# Patient Record
Sex: Male | Born: 1935 | Race: White | Hispanic: No | Marital: Married | Smoking: Former smoker
Health system: Southern US, Community
[De-identification: ages and names within clinical notes are randomized; demographics above are authoritative.]

## PROBLEM LIST (undated history)

## (undated) DIAGNOSIS — E785 Hyperlipidemia, unspecified: Secondary | ICD-10-CM

## (undated) DIAGNOSIS — I2510 Atherosclerotic heart disease of native coronary artery without angina pectoris: Secondary | ICD-10-CM

## (undated) DIAGNOSIS — G40909 Epilepsy, unspecified, not intractable, without status epilepticus: Secondary | ICD-10-CM

## (undated) DIAGNOSIS — G4733 Obstructive sleep apnea (adult) (pediatric): Secondary | ICD-10-CM

## (undated) DIAGNOSIS — R3129 Other microscopic hematuria: Secondary | ICD-10-CM

## (undated) DIAGNOSIS — B351 Tinea unguium: Secondary | ICD-10-CM

## (undated) DIAGNOSIS — E039 Hypothyroidism, unspecified: Secondary | ICD-10-CM

## (undated) DIAGNOSIS — I119 Hypertensive heart disease without heart failure: Secondary | ICD-10-CM

## (undated) DIAGNOSIS — K5790 Diverticulosis of intestine, part unspecified, without perforation or abscess without bleeding: Secondary | ICD-10-CM

## (undated) DIAGNOSIS — R413 Other amnesia: Secondary | ICD-10-CM

## (undated) DIAGNOSIS — E669 Obesity, unspecified: Secondary | ICD-10-CM

## (undated) DIAGNOSIS — I10 Essential (primary) hypertension: Secondary | ICD-10-CM

## (undated) HISTORY — DX: Obesity, unspecified: E66.9

## (undated) HISTORY — DX: Hypertensive heart disease without heart failure: I11.9

## (undated) HISTORY — DX: Tinea unguium: B35.1

## (undated) HISTORY — DX: Other microscopic hematuria: R31.29

## (undated) HISTORY — DX: Hyperlipidemia, unspecified: E78.5

## (undated) HISTORY — DX: Epilepsy, unspecified, not intractable, without status epilepticus: G40.909

## (undated) HISTORY — DX: Essential (primary) hypertension: I10

## (undated) HISTORY — DX: Atherosclerotic heart disease of native coronary artery without angina pectoris: I25.10

## (undated) HISTORY — DX: Diverticulosis of intestine, part unspecified, without perforation or abscess without bleeding: K57.90

## (undated) HISTORY — DX: Other amnesia: R41.3

## (undated) HISTORY — DX: Hypothyroidism, unspecified: E03.9

## (undated) HISTORY — DX: Obstructive sleep apnea (adult) (pediatric): G47.33

---

## 1999-07-17 HISTORY — PX: COLONOSCOPY: SHX174

## 2000-04-03 ENCOUNTER — Encounter (INDEPENDENT_AMBULATORY_CARE_PROVIDER_SITE_OTHER): Payer: Self-pay | Admitting: Specialist

## 2001-11-01 ENCOUNTER — Emergency Department (HOSPITAL_COMMUNITY): Admission: EM | Admit: 2001-11-01 | Discharge: 2001-11-01 | Payer: Self-pay | Admitting: Emergency Medicine

## 2003-08-29 ENCOUNTER — Emergency Department (HOSPITAL_COMMUNITY): Admission: EM | Admit: 2003-08-29 | Discharge: 2003-08-29 | Payer: Self-pay | Admitting: Emergency Medicine

## 2003-09-12 ENCOUNTER — Observation Stay (HOSPITAL_COMMUNITY): Admission: EM | Admit: 2003-09-12 | Discharge: 2003-09-13 | Payer: Self-pay | Admitting: Emergency Medicine

## 2004-06-26 ENCOUNTER — Ambulatory Visit: Payer: Self-pay | Admitting: Internal Medicine

## 2004-10-16 ENCOUNTER — Ambulatory Visit: Payer: Self-pay | Admitting: Internal Medicine

## 2004-12-06 ENCOUNTER — Ambulatory Visit: Payer: Self-pay | Admitting: Internal Medicine

## 2005-01-22 ENCOUNTER — Ambulatory Visit: Payer: Self-pay | Admitting: Pulmonary Disease

## 2006-04-09 ENCOUNTER — Ambulatory Visit: Payer: Self-pay | Admitting: Internal Medicine

## 2006-05-03 ENCOUNTER — Ambulatory Visit: Payer: Self-pay | Admitting: Internal Medicine

## 2006-05-03 LAB — CONVERTED CEMR LAB
ALT: 30 units/L (ref 0–40)
Basophils Relative: 0.6 % (ref 0.0–1.0)
Bilirubin Urine: NEGATIVE
CO2: 30 meq/L (ref 19–32)
Calcium: 9.4 mg/dL (ref 8.4–10.5)
Chloride: 102 meq/L (ref 96–112)
Glomerular Filtration Rate, Af Am: 70 mL/min/{1.73_m2}
Glucose, Bld: 101 mg/dL — ABNORMAL HIGH (ref 70–99)
HDL: 45.8 mg/dL (ref >39.0)
Ketones, ur: NEGATIVE mg/dL
LDL Cholesterol: 58 mg/dL (ref 0–99)
Leukocytes, UA: NEGATIVE
Lymphocytes Relative: 25.3 % (ref 12.0–46.0)
MCV: 95.4 fL (ref 78.0–100.0)
Monocytes Absolute: 0.4 10*3/uL (ref 0.2–0.7)
Nitrite: NEGATIVE
Platelets: 250 10*3/uL (ref 150–400)
TSH: 2.36 microintl units/mL (ref 0.35–5.50)
Total Protein, Urine: 30 mg/dL — AB
Triglyceride fasting, serum: 79 mg/dL (ref 0–149)
Urine Glucose: NEGATIVE mg/dL
VLDL: 16 mg/dL (ref 0–40)
WBC: 5.6 10*3/uL (ref 4.5–10.5)
pH: 7.5 (ref 5.0–8.0)

## 2007-05-06 ENCOUNTER — Ambulatory Visit: Payer: Self-pay | Admitting: Critical Care Medicine

## 2007-06-06 ENCOUNTER — Encounter: Payer: Self-pay | Admitting: Internal Medicine

## 2007-06-06 DIAGNOSIS — B351 Tinea unguium: Secondary | ICD-10-CM

## 2007-06-06 DIAGNOSIS — E785 Hyperlipidemia, unspecified: Secondary | ICD-10-CM

## 2007-06-06 DIAGNOSIS — E669 Obesity, unspecified: Secondary | ICD-10-CM | POA: Insufficient documentation

## 2007-06-06 DIAGNOSIS — I2510 Atherosclerotic heart disease of native coronary artery without angina pectoris: Secondary | ICD-10-CM | POA: Insufficient documentation

## 2007-06-06 DIAGNOSIS — R569 Unspecified convulsions: Secondary | ICD-10-CM | POA: Insufficient documentation

## 2007-06-06 DIAGNOSIS — G4733 Obstructive sleep apnea (adult) (pediatric): Secondary | ICD-10-CM | POA: Insufficient documentation

## 2007-06-06 DIAGNOSIS — I10 Essential (primary) hypertension: Secondary | ICD-10-CM | POA: Insufficient documentation

## 2007-06-06 DIAGNOSIS — E039 Hypothyroidism, unspecified: Secondary | ICD-10-CM | POA: Insufficient documentation

## 2007-06-06 DIAGNOSIS — K5732 Diverticulitis of large intestine without perforation or abscess without bleeding: Secondary | ICD-10-CM

## 2007-06-11 ENCOUNTER — Telehealth (INDEPENDENT_AMBULATORY_CARE_PROVIDER_SITE_OTHER): Payer: Self-pay | Admitting: *Deleted

## 2007-07-01 ENCOUNTER — Telehealth (INDEPENDENT_AMBULATORY_CARE_PROVIDER_SITE_OTHER): Payer: Self-pay | Admitting: *Deleted

## 2007-07-21 ENCOUNTER — Ambulatory Visit: Payer: Self-pay | Admitting: Internal Medicine

## 2007-07-21 DIAGNOSIS — R3129 Other microscopic hematuria: Secondary | ICD-10-CM

## 2007-07-21 DIAGNOSIS — I119 Hypertensive heart disease without heart failure: Secondary | ICD-10-CM | POA: Insufficient documentation

## 2007-07-28 ENCOUNTER — Ambulatory Visit: Payer: Self-pay | Admitting: Internal Medicine

## 2007-08-08 ENCOUNTER — Encounter: Payer: Self-pay | Admitting: Internal Medicine

## 2007-08-25 ENCOUNTER — Encounter: Payer: Self-pay | Admitting: Internal Medicine

## 2007-12-19 ENCOUNTER — Encounter (INDEPENDENT_AMBULATORY_CARE_PROVIDER_SITE_OTHER): Payer: Self-pay | Admitting: Family Medicine

## 2008-06-17 ENCOUNTER — Encounter: Payer: Self-pay | Admitting: Internal Medicine

## 2008-06-23 ENCOUNTER — Telehealth: Payer: Self-pay | Admitting: Internal Medicine

## 2008-07-06 ENCOUNTER — Encounter: Payer: Self-pay | Admitting: Internal Medicine

## 2008-07-16 HISTORY — PX: TUMOR EXCISION: SHX421

## 2008-07-16 HISTORY — PX: CARDIAC CATHETERIZATION: SHX172

## 2008-07-19 ENCOUNTER — Ambulatory Visit: Payer: Self-pay | Admitting: Internal Medicine

## 2008-07-19 DIAGNOSIS — C801 Malignant (primary) neoplasm, unspecified: Secondary | ICD-10-CM | POA: Insufficient documentation

## 2008-11-09 ENCOUNTER — Encounter: Payer: Self-pay | Admitting: Internal Medicine

## 2008-12-15 ENCOUNTER — Encounter: Payer: Self-pay | Admitting: Internal Medicine

## 2008-12-20 ENCOUNTER — Telehealth (INDEPENDENT_AMBULATORY_CARE_PROVIDER_SITE_OTHER): Payer: Self-pay | Admitting: *Deleted

## 2008-12-30 ENCOUNTER — Ambulatory Visit: Payer: Self-pay | Admitting: Internal Medicine

## 2009-01-18 ENCOUNTER — Encounter: Payer: Self-pay | Admitting: Internal Medicine

## 2009-04-19 ENCOUNTER — Encounter: Payer: Self-pay | Admitting: Internal Medicine

## 2009-07-19 ENCOUNTER — Encounter: Payer: Self-pay | Admitting: Internal Medicine

## 2009-07-25 ENCOUNTER — Ambulatory Visit: Payer: Self-pay | Admitting: Internal Medicine

## 2009-07-26 ENCOUNTER — Encounter: Payer: Self-pay | Admitting: Internal Medicine

## 2009-07-27 LAB — CONVERTED CEMR LAB
ALT: 19 units/L (ref 0–53)
AST: 20 units/L (ref 0–37)
Albumin: 4 g/dL (ref 3.5–5.2)
BUN: 18 mg/dL (ref 6–23)
Basophils Relative: 0.8 % (ref 0.0–3.0)
Bilirubin Urine: NEGATIVE
CO2: 31 meq/L (ref 19–32)
Chloride: 100 meq/L (ref 96–112)
Cholesterol: 155 mg/dL (ref 0–200)
Creatinine, Ser: 1.1 mg/dL (ref 0.4–1.5)
Eosinophils Absolute: 0.3 10*3/uL (ref 0.0–0.7)
Eosinophils Relative: 7.7 % — ABNORMAL HIGH (ref 0.0–5.0)
HCT: 38.2 % — ABNORMAL LOW (ref 39.0–52.0)
LDL Cholesterol: 86 mg/dL (ref 0–99)
Leukocytes, UA: NEGATIVE
Lymphs Abs: 0.8 10*3/uL (ref 0.7–4.0)
MCHC: 34.1 g/dL (ref 30.0–36.0)
MCV: 101 fL — ABNORMAL HIGH (ref 78.0–100.0)
Monocytes Absolute: 0.3 10*3/uL (ref 0.1–1.0)
Neutrophils Relative %: 61.7 % (ref 43.0–77.0)
Nitrite: NEGATIVE
Platelets: 201 10*3/uL (ref 150.0–400.0)
Potassium: 5.1 meq/L (ref 3.5–5.1)
TSH: 2.45 microintl units/mL (ref 0.35–5.50)
Triglycerides: 74 mg/dL (ref 0.0–149.0)
Urobilinogen, UA: 0.2 (ref 0.0–1.0)
WBC: 3.9 10*3/uL — ABNORMAL LOW (ref 4.5–10.5)

## 2010-01-17 ENCOUNTER — Encounter: Payer: Self-pay | Admitting: Internal Medicine

## 2010-02-13 ENCOUNTER — Telehealth (INDEPENDENT_AMBULATORY_CARE_PROVIDER_SITE_OTHER): Payer: Self-pay | Admitting: *Deleted

## 2010-02-14 ENCOUNTER — Ambulatory Visit: Payer: Self-pay | Admitting: Internal Medicine

## 2010-02-14 DIAGNOSIS — R413 Other amnesia: Secondary | ICD-10-CM | POA: Insufficient documentation

## 2010-02-15 ENCOUNTER — Ambulatory Visit: Payer: Self-pay | Admitting: Cardiology

## 2010-02-15 LAB — CONVERTED CEMR LAB
BUN: 19 mg/dL (ref 6–23)
Basophils Absolute: 0 10*3/uL (ref 0.0–0.1)
CO2: 30 meq/L (ref 19–32)
Calcium: 9.4 mg/dL (ref 8.4–10.5)
Creatinine, Ser: 1.2 mg/dL (ref 0.4–1.5)
Eosinophils Absolute: 0.2 10*3/uL (ref 0.0–0.7)
Glucose, Bld: 92 mg/dL (ref 70–99)
Hemoglobin: 13 g/dL (ref 13.0–17.0)
Lymphocytes Relative: 25.7 % (ref 12.0–46.0)
MCHC: 34.4 g/dL (ref 30.0–36.0)
Neutro Abs: 3.9 10*3/uL (ref 1.4–7.7)
Neutrophils Relative %: 62.4 % (ref 43.0–77.0)
Platelets: 229 10*3/uL (ref 150.0–400.0)
RDW: 13.1 % (ref 11.5–14.6)
TSH: 3.12 microintl units/mL (ref 0.35–5.50)

## 2010-02-23 ENCOUNTER — Encounter (INDEPENDENT_AMBULATORY_CARE_PROVIDER_SITE_OTHER): Payer: Self-pay | Admitting: *Deleted

## 2010-03-02 ENCOUNTER — Ambulatory Visit: Payer: Self-pay | Admitting: Internal Medicine

## 2010-03-02 ENCOUNTER — Encounter: Payer: Self-pay | Admitting: Adult Health

## 2010-03-02 ENCOUNTER — Telehealth: Payer: Self-pay | Admitting: Adult Health

## 2010-03-03 LAB — CONVERTED CEMR LAB: Folate: >20 ng/mL

## 2010-04-18 ENCOUNTER — Encounter: Payer: Self-pay | Admitting: Internal Medicine

## 2010-05-26 ENCOUNTER — Ambulatory Visit: Payer: Self-pay | Admitting: Cardiology

## 2010-05-26 ENCOUNTER — Inpatient Hospital Stay (HOSPITAL_COMMUNITY): Admission: EM | Admit: 2010-05-26 | Discharge: 2010-05-29 | Payer: Self-pay | Admitting: Emergency Medicine

## 2010-05-29 ENCOUNTER — Encounter (INDEPENDENT_AMBULATORY_CARE_PROVIDER_SITE_OTHER): Payer: Self-pay | Admitting: Internal Medicine

## 2010-05-30 ENCOUNTER — Telehealth (INDEPENDENT_AMBULATORY_CARE_PROVIDER_SITE_OTHER): Payer: Self-pay | Admitting: *Deleted

## 2010-06-05 ENCOUNTER — Ambulatory Visit: Payer: Self-pay | Admitting: Internal Medicine

## 2010-06-05 DIAGNOSIS — N259 Disorder resulting from impaired renal tubular function, unspecified: Secondary | ICD-10-CM | POA: Insufficient documentation

## 2010-06-06 LAB — CONVERTED CEMR LAB
BUN: 17 mg/dL (ref 6–23)
Chloride: 97 meq/L (ref 96–112)
Creatinine, Ser: 1.1 mg/dL (ref 0.4–1.5)

## 2010-07-05 ENCOUNTER — Ambulatory Visit: Payer: Self-pay | Admitting: Internal Medicine

## 2010-07-18 ENCOUNTER — Encounter: Payer: Self-pay | Admitting: Internal Medicine

## 2010-08-13 LAB — CONVERTED CEMR LAB
ALT: 23 units/L (ref 0–53)
ALT: 28 units/L (ref 0–53)
Albumin: 4 g/dL (ref 3.5–5.2)
Albumin: 4.1 g/dL (ref 3.5–5.2)
Alkaline Phosphatase: 55 units/L (ref 39–117)
BUN: 15 mg/dL (ref 6–23)
Bacteria, UA: NEGATIVE
Basophils Absolute: 0 10*3/uL (ref 0.0–0.1)
Basophils Relative: 0.7 % (ref 0.0–1.0)
Bilirubin Urine: NEGATIVE
Bilirubin Urine: NEGATIVE
CO2: 30 meq/L (ref 19–32)
Calcium: 9.6 mg/dL (ref 8.4–10.5)
Cholesterol: 104 mg/dL (ref 0–200)
Creatinine, Ser: 1.2 mg/dL (ref 0.4–1.5)
Crystals: NEGATIVE
Crystals: NEGATIVE
GFR calc Af Amer: 76 mL/min
Ketones, ur: NEGATIVE mg/dL
LDL Cholesterol: 42 mg/dL (ref 0–99)
LDL Cholesterol: 51 mg/dL (ref 0–99)
Leukocytes, UA: NEGATIVE
Leukocytes, UA: NEGATIVE
Monocytes Relative: 7.9 % (ref 3.0–11.0)
Platelets: 233 10*3/uL (ref 150–400)
RBC: 4.39 M/uL (ref 4.22–5.81)
RDW: 12.4 % (ref 11.5–14.6)
Specific Gravity, Urine: 1.02 (ref 1.000–1.03)
Specific Gravity, Urine: 1.02 (ref 1.000–1.03)
TSH: 2.28 microintl units/mL (ref 0.35–5.50)
Total CHOL/HDL Ratio: 2.2
Total CHOL/HDL Ratio: 2.3
Total Protein, Urine: 100 mg/dL — AB
Total Protein: 6.7 g/dL (ref 6.0–8.3)
Total Protein: 7 g/dL (ref 6.0–8.3)
Triglycerides: 88 mg/dL (ref 0–149)
Triglycerides: 89 mg/dL (ref 0–149)
Urine Glucose: NEGATIVE mg/dL
Urine Glucose: NEGATIVE mg/dL
Urobilinogen, UA: 0.2 (ref 0.0–1.0)
VLDL: 18 mg/dL (ref 0–40)
VLDL: 18 mg/dL (ref 0–40)
WBC, UA: NONE SEEN cells/hpf
pH: 6 (ref 5.0–8.0)

## 2010-08-17 NOTE — Assessment & Plan Note (Signed)
Summary: NP office visit - MMSE   Primary Provider/Referring Provider:  Sherene Sires  CC:  MMSE - no new complaints.  History of Present Illness: 52 yowm with  history of hypertenision, hyperlipidemia, and hypothyroidism.    July 19, 2008 cpx recovering from sarcoma surgery at [facility] and has been referred to Oncology and planning to start chemo with extensive labs and xrays there in 12/09.    December 30, 2008 ov for eval of orthostatic lightheadedness resolved off amlodipine for recheck bp doing well now.  rec stop amlodipine and no more spells ok self monitoring  July 25, 2009 ov fastin for recheck bp/lipids/hypothyroid.  Energy level good, bp a bit high but no symptoms of ha, dizziness.   February 14, 2010 Acute visit.  Pt's spouse and children are concerned that pt is depressed- "less engaged" then he used to be.  Spouse states that he spends most of the day resting.  He also has been more irritable. Family has noticed memory loss over the past 6 months.  He also c/o some hearing loss but is not concerned re their perceived concerns.    March 02, 2010--Presents for follow up and memory evaluation. Family has noticed over last 6 months his memory is not as good at times. He easily forgets things. Pt says he feels he has slowed down since retirement last few years. His wife is very busy, he helps with her projects and helps with church work.  He denies any tremor or headache. Labs from last visit were essentially unremarkable.   Medications Prior to Update: 1)  Zocor 40 Mg Tabs (Simvastatin) .... Take 1 Tablet By Mouth Once A Day 2)  Synthroid 75 Mcg  Tabs (Levothyroxine Sodium) .Marland Kitchen.. 1 By Mouth Once Daily 3)  Multi Vitamins .... Once Daily 4)  Warfarin Sodium 1 Mg Tabs (Warfarin Sodium) .Marland Kitchen.. 1 Once Daily 5)  Tylenol 500mg  .... As Needed 6)  Advil 200 Mg Tabs (Ibuprofen) .Marland Kitchen.. 1-4 With Meal (Up To 12 Daily) 7)  Atenolol-Chlorthalidone 50-25 Mg Tabs (Atenolol-Chlorthalidone) .... One Half  Daily  Current Medications (verified): 1)  Zocor 40 Mg Tabs (Simvastatin) .... Take 1 Tablet By Mouth Once A Day 2)  Synthroid 75 Mcg  Tabs (Levothyroxine Sodium) .Marland Kitchen.. 1 By Mouth Once Daily 3)  Multivitamins   Tabs (Multiple Vitamin) .... Take 1 Tablet By Mouth Once A Day 4)  Warfarin Sodium 1 Mg Tabs (Warfarin Sodium) .Marland Kitchen.. 1 Once Daily 5)  Tylenol Extra Strength 500 Mg Tabs (Acetaminophen) .... Per Bottle 6)  Advil 200 Mg Tabs (Ibuprofen) .Marland Kitchen.. 1-4 With Meal (Up To 12 Daily) 7)  Atenolol-Chlorthalidone 50-25 Mg Tabs (Atenolol-Chlorthalidone) .... One Half Daily  Allergies (verified): No Known Drug Allergies  Past History:  Past Surgical History: Last updated: 07/25/2009 colonoscopy 2001 Heart cath 2010  Family History: Last updated: 07/19/2008 IHD father, smoker Breast Ca Father  Social History: Last updated: 07/25/2009 Patient states former smoker quit around 1970 ETOH- Wine occ Retired Surveyor, minerals  Risk Factors: Smoking Status: quit (07/21/2007)  Past Medical History: MICROSCOPIC HEMATURIA (ICD-599.72)...................................................Marland KitchenWrenn HYPERTENSIVE CARDIOVASCULAR DISEASE, BENIGN (ICD-402.10) ONYCHOMYCOSIS (ICD-110.1) DIVERTICULOSIS.........................................................................................Marland KitchenRussella Dar       - See colonscopy 03/2000 HYPERLIPIDEMIA (ICD-272.4)      - Target LDL < 70 (hbp, male, pos fm hx/ pos mrangiogram 2/05 ascvd SLEEP APNEA, OBSTRUCTIVE (ICD-327.23) OBESITY (ICD-278.00)     -  Target wt  =   208 for BMI < 30  HYPOTHYROIDISM (ICD-244.9) SEIZURE DISORDER (ICD-780.39) CARDIOVASCULAR DISEASE (ICD-429.2) HYPERTENSION (ICD-401.9) Sarcoma  left leg........................................................................................................Marland KitchenWard, [facility])      - 07/14/08 sarcoma removal left femur >  last rx July 2010 > CR by scans 07/2009      - Chemo complete 12/2008 Memory Loss indolent onset  20111      - CT Head February 14, 2010 >>>chronic microvas./ischemic changes      - MMSE rec February 14, 2010 >>30/30, nml clock draw HEALTH MAINTENANCE.............................................................................................Marland KitchenWert      - Pneumovax 04/1999      - DT 10/07      - CPX   July 25, 2009   Review of Systems      See HPI  Vital Signs:  Patient profile:   77 year old male Height:      71 inches Weight:      201 pounds BMI:     28.14 O2 Sat:      96 % on Room air Temp:     98.2 degrees F oral Pulse rate:   71 / minute BP sitting:   130 / 80  (left arm) Cuff size:   regular  Vitals Entered By: Boone Master CNA/MA (March 02, 2010 2:27 PM)  O2 Flow:  Room air CC: MMSE - no new complaints Is Patient Diabetic? No Comments Medications reviewed with patient Daytime contact number verified with patient. Boone Master CNA/MA  March 02, 2010 2:27 PM    Physical Exam  Additional Exam:  wt 214  July 19, 2008 >  198 December 31, 2008  > 196 July 25, 2009 > 199 February 14, 2010 >>201 03/02/10 Ambulatory healthy appearing in no acute distress. HEENT: nl dentition, turbinates, and orophanx. Nl external ear canals without cough reflex/ grossly nl hearing Neck without JVD/Nodes/TM Lungs clear to A and P bilaterally without cough on insp or exp maneuvers RRR no s3 or murmur or increase in P2 Abd soft and benign with nl excursion in the supine position. No bruits or organomegaly Ext warm without calf tenderness, cyanosis clubbing.   Neuro alert, 0x4, good short and longterm recall.    NML CLOCK DRAW MMSE 30/30       Impression & Recommendations:  Problem # 1:  MEMORY LOSS (ICD-780.93) Subtle memory impairment mostly noticed by family. pt appears w/ no obvious deficits today.  RPR neg, B12 nml He completed the MMSE and clock draq very well, with no apparent difficulty.  I have explained to pt and his wife (on phone) my findings,  will plan on repeat  in 1 year if cont to have noticed memory issues and sooner if needed may need neuro referral if test cont nml but memory cont to decrease advised on "brain teasers" , etc  Orders: T-RPR (Syphilis) (16109-60454) TLB-B12 + Folate Pnl (09811_91478-G95/AOZ) Est. Patient Level IV (30865)  Medications Added to Medication List This Visit: 1)  Multivitamins Tabs (Multiple vitamin) .... Take 1 tablet by mouth once a day 2)  Tylenol Extra Strength 500 Mg Tabs (Acetaminophen) .... Per bottle  Complete Medication List: 1)  Zocor 40 Mg Tabs (Simvastatin) .... Take 1 tablet by mouth once a day 2)  Synthroid 75 Mcg Tabs (Levothyroxine sodium) .Marland Kitchen.. 1 by mouth once daily 3)  Multivitamins Tabs (Multiple vitamin) .... Take 1 tablet by mouth once a day 4)  Warfarin Sodium 1 Mg Tabs (Warfarin sodium) .Marland Kitchen.. 1 once daily 5)  Tylenol Extra Strength 500 Mg Tabs (Acetaminophen) .... Per bottle 6)  Advil 200 Mg Tabs (Ibuprofen) .Marland Kitchen.. 1-4 with meal (  up to 12 daily) 7)  Atenolol-chlorthalidone 50-25 Mg Tabs (Atenolol-chlorthalidone) .... One half daily  Patient Instructions: 1)  Keep acitve, "brain teasers" keep lists, stay organized,  2)  cross word puzzles, board games, reading, etc,  3)  We can recheck your memory test in 1 year if still having trouble 4)  I will call with lab results.  5)  Please contact office for sooner follow up if symptoms do not improve or worsen    Immunization History:  Influenza Immunization History:    Influenza:  historical (05/16/2009)

## 2010-08-17 NOTE — Assessment & Plan Note (Signed)
Summary: Primary svc/ ext post hosp f/u ov for syncope   Primary Provider/Referring Provider:  Sherene Sires  CC:  Dizziness- resolved.  History of Present Illness: 45 yowm with  history of hypertenision, hyperlipidemia, and hypothyroidism.    July 19, 2008 cpx recovering from sarcoma surgery at [facility] and has been referred to Oncology and planning to start chemo with extensive labs and xrays there in 12/09.    December 30, 2008 ov for eval of orthostatic lightheadedness resolved off amlodipine for recheck bp doing well now.  rec stop amlodipine and no more spells ok self monitoring  July 25, 2009 ov fastin for recheck bp/lipids/hypothyroid.  Energy level good, bp a bit high but no symptoms of ha, dizziness.   February 14, 2010 Acute visit.  Pt's spouse and children are concerned that pt is depressed- "less engaged" then he used to be.  Spouse states that he spends most of the day resting.  He also has been more irritable. Family has noticed memory loss over the past 6 months.  He also c/o some hearing loss but is not concerned re their perceived concerns.    March 02, 2010--Presents for follow up and memory evaluation.> . 30/30, nml clock draw so rec recheck yearly  [facility] 11/11-14/2011 syncope, hyponatremia, nl echo and MRA of brain > d/c hctz   June 05, 2010 ov post hosp for syncope no recurrence. Pt denies any significant sore throat, dysphagia, itching, sneezing,  nasal congestion or excess secretions,  fever, chills, sweats, unintended wt loss, pleuritic or exertional cp, hempoptysis, change in activity tolerance  orthopnea pnd or leg swelling.    Current Medications (verified): 1)  Zocor 40 Mg Tabs (Simvastatin) .... Take 1 Tablet By Mouth Once A Day 2)  Synthroid 75 Mcg  Tabs (Levothyroxine Sodium) .Marland Kitchen.. 1 By Mouth Once Daily 3)  Multivitamins   Tabs (Multiple Vitamin) .... Take 1 Tablet By Mouth Once A Day 4)  Warfarin Sodium 1 Mg Tabs (Warfarin Sodium) .Marland Kitchen.. 1 Once Daily 5)   Tylenol Extra Strength 500 Mg Tabs (Acetaminophen) .... Per Bottle 6)  Advil 200 Mg Tabs (Ibuprofen) .Marland Kitchen.. 1-4 With Meal (Up To 12 Daily) 7)  Aspirin 81 Mg Tbec (Aspirin) .Marland Kitchen.. 1 Once Daily 8)  Atenolol 25 Mg Tabs (Atenolol) .Marland Kitchen.. 1 Once Daily 9)  Losartan Potassium 50 Mg Tabs (Losartan Potassium) .Marland Kitchen.. 1 Once Daily  Allergies (verified): No Known Drug Allergies  Past History:  Past Medical History: MICROSCOPIC HEMATURIA (ICD-599.72)...................................................Marland KitchenWrenn HYPERTENSIVE CARDIOVASCULAR DISEASE, BENIGN (ICD-402.10)     - Echo with G I dias dysfunction11/14/11 ONYCHOMYCOSIS (ICD-110.1) DIVERTICULOSIS.........................................................................................Marland KitchenRussella Dar       - See colonscopy 03/2000 HYPERLIPIDEMIA (ICD-272.4)      - Target LDL < 70 (hbp, male, pos fm hx/ pos mrangiogram 2/05 ascvd SLEEP APNEA, OBSTRUCTIVE (ICD-327.23) OBESITY (ICD-278.00)     -  Target wt  =   208 for BMI < 30  HYPOTHYROIDISM (ICD-244.9) SEIZURE DISORDER (ICD-780.39) CARDIOVASCULAR DISEASE (ICD-429.2) HYPERTENSION (ICD-401.9) Sarcoma left leg........................................................................................................Marland KitchenWard, [facility])      - 07/14/08 sarcoma removal left femur >  last rx July 2010 > CR by scans 07/2009      - Chemo complete 12/2008 Memory Loss indolent onset 20111      - CT Head February 14, 2010 >>>chronic microvas./ischemic changes      - MMSE rec February 14, 2010 >>30/30, nml clock draw      - MRI brain atrophy and small vessel dz worse since 2005 HEALTH MAINTENANCE.............................................................................................Marland KitchenWert      -  Pneumovax 04/1999      - DT 10/07      - CPX   July 25, 2009  Syncope  Admit [facility]   Vital Signs:  Patient profile:   77 year old male Weight:      202.38 pounds O2 Sat:      99 % on Room air Temp:     97.7 degrees F oral Pulse  rate:   76 / minute BP sitting:   158 / 70  (left arm)  Vitals Entered By: [name] (June 05, 2010 9:35 AM)  O2 Flow:  Room air  Physical Exam  Additional Exam:  wt 214  July 19, 2008 >  198 December 31, 2008  > 196 July 25, 2009 > 199 February 14, 2010 >>201 03/02/10 > 202 June 05, 2010  Ambulatory healthy appearing in no acute distress. HEENT: nl dentition, turbinates, and orophanx. Nl external ear canals without cough reflex/ grossly nl hearing Neck without JVD/Nodes/TM Lungs clear to A and P bilaterally without cough on insp or exp maneuvers RRR no s3 or murmur or increase in P2 Abd soft and benign with nl excursion in the supine position. No bruits or organomegaly Ext warm without calf tenderness, cyanosis clubbing.   Neuro alert, 0x4, good short and longterm recall.          Sodium               [L]  132 mEq/L                   135-145   Potassium                 4.6 mEq/L                   3.5-5.1   Chloride                  97 mEq/L                    96-112   Carbon Dioxide            28 mEq/L                    19-32   Glucose                   90 mg/dL                    04-54   BUN                       17 mg/dL                    0-98   Creatinine                1.1 mg/dL                   1.1-9.1   Calcium                   9.2 mg/dL                   4.7-82.9   GFR                       66.04 mL/min                >  60  Impression & Recommendations:  Problem # 1:  HYPERTENSION (ICD-401.9) ok on rx off hctz indefinitely, since syncope recent will let bp stay on high side for now  The following medications were removed from the medication list:    Atenolol-chlorthalidone 50-25 Mg Tabs (Atenolol-chlorthalidone) ..... One half daily His updated medication list for this problem includes:    Losartan Potassium 50 Mg Tabs (Losartan potassium) .Marland Kitchen... 1 once daily    Atenolol 25 Mg Tabs (Atenolol) .Marland Kitchen... 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  Orders: Est. Patient Level IV (25852)  Problem # 3:  MEMORY LOSS (ICD-780.93)  not progressive, f/u conservatively  Orders: Est. Patient Level IV (77824)  Medications Added to Medication List This Visit: 1)  Losartan Potassium 50 Mg Tabs (Losartan potassium) .Marland Kitchen.. 1 once daily 2)  Aspirin 81 Mg Tbec (Aspirin) .Marland Kitchen.. 1 once daily 3)  Atenolol 25 Mg Tabs (Atenolol) .Marland Kitchen.. 1 once daily  Other Orders: TLB-BMP (Basic Metabolic Panel-BMET) (80048-METABOL)  Patient Instructions: 1)  Please schedule a follow-up appointment in 4  weeks, sooner if needed  2)  if feel faint in future lie down as soon as possible

## 2010-08-17 NOTE — Letter (Signed)
Summary: OV/[facility]  OV/[facility]   Imported By: Sherian Rein 07/28/2009 14:53:01  _____________________________________________________________________  External Attachment:    Type:   Image     Comment:   External Document

## 2010-08-17 NOTE — Progress Notes (Signed)
Summary: speak to tp (returning call)  Phone Note Call from Patient Call back at Home Phone [phone]   Caller: Patient Call For: tammy parrett Summary of Call: pt's wife sharon Cerniglia says she is returning a call to tp re: f/u on pt's ov today.  Initial call taken by: Tivis Ringer, CNA,  March 02, 2010 5:18 PM  Follow-up for Phone Call        spoke with wife as pt requested, infor of ov findings Follow-up by: Tammy Parrett NP,  March 03, 2010 10:16 AM

## 2010-08-17 NOTE — Progress Notes (Signed)
Summary: Appt sched to for mult issues  Phone Note Call from Patient   Caller: Spouse Call For: [name] Summary of Call: NEED TO TALK TO DR Sherene Sires ABOUT PT'S CONDITION Initial call taken by: Rickard Patience,  February 13, 2010 4:00 PM  Follow-up for Phone Call        Spoke with pt's spouse.  She is concerned about pt being depressed- states that he lacks energy, does not engage in any activities that he used to enjoy, and also has had some memory loss.  She also wants referral to a hearing clinic for decreased hearing.  I advised needs ov- appt sched with MW for 3:30 tommorrow.   Follow-up by: [name],  February 13, 2010 5:12 PM

## 2010-08-17 NOTE — Letter (Signed)
Summary: [facility]  [facility]   Imported By: Lester Brush 03/28/2010 08:38:36  _____________________________________________________________________  External Attachment:    Type:   Image     Comment:   External Document

## 2010-08-17 NOTE — Assessment & Plan Note (Signed)
Summary: Primary svc/ f/u hbp/hyperlipidemia, not fasting   Primary Provider/Referring Provider:  Sherene Sires  CC:  Hypertenion and hyperlipidemia.  History of Present Illness: 89  yowm with  history of hypertenision, hyperlipidemia, and hypothyroidism.    July 19, 2008 cpx recovering from sarcoma surgery at [facility] and has been referred to Oncology and planning to start chemo with extensive labs and xrays there in 12/09.    December 30, 2008 ov for eval of orthostatic lightheadedness resolved off amlodipine for recheck bp doing well now.  rec stop amlodipine and no more spells ok self monitoring  July 25, 2009 ov fastin for recheck bp/lipids/hypothyroid.  Energy level good, bp a bit high but no symptoms of ha, dizziness.   February 14, 2010 Acute visit.  Pt's spouse and children are concerned that pt is depressed- "less engaged" then he used to be.  Spouse states that he spends most of the day resting.  He also has been more irritable. Family has noticed memory loss over the past 6 months.  He also c/o some hearing loss but is not concerned re their perceived concerns.    March 02, 2010--Presents for follow up and memory evaluation.> . 30/30, nml clock draw so rec recheck yearly  Admit [facility] 11/11-14/2011 syncope, hyponatremia, nl echo and MRA of brain > d/c hctz   June 05, 2010 ov post hosp for syncope no recurrence.   August 07, 2010 ov f/u hbp/ hyperlipidemia not fasting, no presyncope or cp/ tia or claudication.Pt denies any significant sore throat, dysphagia, itching, sneezing,  nasal congestion or excess secretions,  fever, chills, sweats, unintended wt loss, pleuritic or exertional cp, hempoptysis, change in activity tolerance  orthopnea pnd or leg swelling   Current Medications (verified): 1)  Zocor 40 Mg Tabs (Simvastatin) .... Take 1 Tablet By Mouth Once A Day 2)  Synthroid 75 Mcg  Tabs (Levothyroxine Sodium) .Marland Kitchen.. 1 By Mouth Once Daily 3)  Multivitamins   Tabs (Multiple Vitamin)  .... Take 1 Tablet By Mouth Once A Day 4)  Warfarin Sodium 1 Mg Tabs (Warfarin Sodium) .Marland Kitchen.. 1 Once Daily 5)  Losartan Potassium 50 Mg Tabs (Losartan Potassium) .Marland Kitchen.. 1 Once Daily 6)  Aspirin 81 Mg Tbec (Aspirin) .Marland Kitchen.. 1 Once Daily 7)  Atenolol 25 Mg Tabs (Atenolol) .Marland Kitchen.. 1 Once Daily 8)  Tylenol Extra Strength 500 Mg Tabs (Acetaminophen) .... Per Bottle 9)  Advil 200 Mg Tabs (Ibuprofen) .Marland Kitchen.. 1-4 With Meal (Up To 12 Daily)  Allergies (verified): No Known Drug Allergies  Past History:  Past Medical History: MICROSCOPIC HEMATURIA (ICD-599.72)...................................................Marland KitchenWrenn HYPERTENSIVE CARDIOVASCULAR DISEASE, BENIGN (ICD-402.10)     - Echo with G I dias dysfunction11/14/11 ONYCHOMYCOSIS (ICD-110.1) DIVERTICULOSIS.........................................................................................Marland KitchenRussella Dar       - See colonscopy 03/2000 HYPERLIPIDEMIA (ICD-272.4)      - Target LDL < 70 (hbp, male, pos fm hx/ pos mrangiogram 2/05 ascvd SLEEP APNEA, OBSTRUCTIVE (ICD-327.23) OBESITY (ICD-278.00)     -  Target wt  =   208 for BMI < 30  HYPOTHYROIDISM (ICD-244.9) SEIZURE DISORDER (ICD-780.39) CARDIOVASCULAR DISEASE (ICD-429.2) HYPERTENSION (ICD-401.9) Sarcoma left leg........................................................................................................Marland KitchenWard, [facility])      - 07/14/08 sarcoma removal left femur >  last rx July 2010 > CR by scans 07/2009      - Chemo complete 12/2008 Memory Loss indolent onset 20111      - CT Head February 14, 2010 >>>chronic microvas./ischemic changes      - MMSE rec February 14, 2010 >>30/30, nml clock draw      -  MRI brain atrophy and small vessel dz worse since 2005 HEALTH MAINTENANCE................................................................................................Marland KitchenWert      - Pneumovax 04/1999 age 3      - DT 10/07      - CPX   July 25, 2009  Syncope  Admit [facility]   Vital Signs:  Patient  profile:   77 year old male Weight:      205 pounds O2 Sat:      99 % on Room air Temp:     97.9 degrees F oral Pulse rate:   68 / minute BP sitting:   142 / 84  (left arm)  Vitals Entered By: [name] (August 07, 2010 9:44 AM)  O2 Flow:  Room air  Physical Exam  Additional Exam:  wt 214  July 19, 2008 >201 03/02/10 > 202 June 05, 2010 > 205 August 07, 2010  Ambulatory healthy appearing in no acute distress. HEENT: nl dentition, turbinates, and orophanx. Nl external ear canals without cough reflex/ grossly nl hearing Neck without JVD/Nodes/TM Lungs clear to A and P bilaterally without cough on insp or exp maneuvers RRR no s3 or murmur or increase in P2 Abd soft and benign with nl excursion in the supine position. No bruits or organomegaly Ext warm without calf tenderness, cyanosis clubbing.   Neuro alert, 0x4, good short and longterm recall.          Impression & Recommendations:  Problem # 1:  HYPERTENSION (ICD-401.9)  His updated medication list for this problem includes:    Losartan Potassium 50 Mg Tabs (Losartan potassium) .Marland Kitchen... 1 once daily    Atenolol 25 Mg Tabs (Atenolol) .Marland Kitchen... 1 once daily   ok on rx  Orders: Est. Patient Level III (62952)  Problem # 2:  HYPERLIPIDEMIA (ICD-272.4)  His updated medication list for this problem includes:    Zocor 40 Mg Tabs (Simvastatin) .Marland Kitchen... Take 1 tablet by mouth once a day  Labs Reviewed: SGOT: 20 (07/25/2009)   SGPT: 19 (07/25/2009)   HDL:54.70 (07/25/2009), 44.5 (07/19/2008)  LDL:86 (07/25/2009), 42 (07/19/2008)  Chol:155 (07/25/2009), 104 (07/19/2008)  Trig:74.0 (07/25/2009), 89 (07/19/2008)   cpx needed after next ? last f/u at [facility]  Orders: Est. Patient Level III (84132)  Patient Instructions: 1)  At this point we can do whatever follow up on the sarcoma is needed and refer you back if needed. 2)  Return to office in 3 months with CPX on return

## 2010-08-17 NOTE — Letter (Signed)
Summary: Sarcoma[facility]   Imported By: Sherian Rein 04/27/2010 13:26:43  _____________________________________________________________________  External Attachment:    Type:   Image     Comment:   External Document

## 2010-08-17 NOTE — Letter (Signed)
Summary: Sarcoma[facility]  Sarcoma/[facility]   Imported By: Sherian Rein 07/27/2009 10:17:39  _____________________________________________________________________  External Attachment:    Type:   Image     Comment:   External Document

## 2010-08-17 NOTE — Letter (Signed)
Summary: Colonoscopy Letter  [facility]  [address] [place], [place] [address]   Phone: [phone]  Fax: [phone]      February 23, 2010 MRN: [id number]   [name] [address] [place], [place]  [address]   Dear [name],   According to your medical record, it is time for you to schedule a Colonoscopy. The American Cancer Society recommends this procedure as a method to detect early colon cancer. Patients with a family history of colon cancer, or a personal history of colon polyps or inflammatory bowel disease are at increased risk.  This letter has beeen generated based on the recommendations made at the time of your procedure. If you feel that in your particular situation this may no longer apply, please contact our office.  Please call our office at [phone] to schedule this appointment or to update your records at your earliest convenience.  Thank you for cooperating with [place] to provide you with the very best care possible.   Sincerely,  Judie Petit T. [name], M.D.  [facility] [phone]

## 2010-08-17 NOTE — Assessment & Plan Note (Signed)
Summary: Primary svc/ comprehensive eval   Primary Provider/Referring Provider:  Sherene [name]  CC:  cpx fasting.  History of Present Illness: 8 yowm with  history of hypertenision, hyperlipidemia, and hypothyroidism     July 19, 2008 cpx recovering from sarcoma surgery at [facility] and has been referred to Oncology and planning to start chemo with extensive labs and xrays there in 12/09.    December 30, 2008 ov for eval of orthostatic lightheadedness resolved off amlodipine for recheck bp doing well now.  rec stop amlodipine and no more spells ok self monitoring  July 25, 2009 ov fastin for recheck bp/lipids/hypothyroid.  Energy level good, bp [name] bit high but no symptoms of ha, dizziness.  Pt denies any significant sore throat, dysphagia, itching, sneezing,  nasal congestion or excess secretions,  fever, chills, sweats, unintended wt loss, pleuritic or exertional cp, hempoptysis, change in activity tolerance  orthopnea pnd or leg swelling.  Current Medications (verified): 1)  Atenolol 50 Mg  Tabs (Atenolol) .... 1/2 Tab Once Daily 2)  Zocor 40 Mg Tabs (Simvastatin) .... Take 1 Tablet By Mouth Once [name] Day 3)  Synthroid 75 Mcg  Tabs (Levothyroxine Sodium) .Marland Kitchen.. 1 By Mouth Once Daily 4)  Multi Vitamins .... Once Daily 5)  Warfarin Sodium 1 Mg Tabs (Warfarin Sodium) .Marland Kitchen.. 1 Once Daily 6)  Tylenol 500mg  .... As Needed 7)  Advil 200 Mg Tabs (Ibuprofen) .Marland Kitchen.. 1-4 With Meal (Up To 12 Daily)  Allergies (verified): No Known Drug Allergies  Past History:  Past Medical History: MICROSCOPIC HEMATURIA (ICD-599.72)...................................................Marland KitchenWrenn HYPERTENSIVE CARDIOVASCULAR DISEASE, BENIGN (ICD-402.10) ONYCHOMYCOSIS (ICD-110.1) DIVERTICULOSIS.........................................................................................Marland KitchenRussella [name]       - See colonscopy 03/2000 HYPERLIPIDEMIA (ICD-272.4)      - Target LDL < 70 (hbp, male, pos fm hx/ pos mrangiogram 2/05 ascvd SLEEP APNEA,  OBSTRUCTIVE (ICD-327.23) OBESITY (ICD-278.00)     -  Target wt  =   208 for BMI < 30  HYPOTHYROIDISM (ICD-244.9) SEIZURE DISORDER (ICD-780.39) CARDIOVASCULAR DISEASE (ICD-429.2) HYPERTENSION (ICD-401.9) Sarcoma left leg........................................................................................................Marland KitchenWard, [facility])      - 07/14/08 sarcoma removal left femur >  last rx July 2010 > CR by scans 07/2009 HEALTH MAINTENANCE.............................................................................................Marland KitchenWert      - Pneumovax 04/1999      - DT 10/07      - CPX   July 25, 2009   Past Surgical History: colonoscopy 2001 Heart cath 2010  Family History: Reviewed history from 07/19/2008 and no changes required. IHD father, smoker Breast Ca Father  Social History: Patient states former smoker quit around 1970 ETOH- Wine occ Retired Surveyor, minerals  Vital Signs:  Patient profile:   77 year old male Weight:      196.13 pounds O2 Sat:      99 % on Room air Temp:     97.4 degrees F oral Pulse rate:   63 / minute BP sitting:   140 / 80  (left arm)  Vitals Entered By: [name] (July 25, 2009 8:44 AM)  O2 Flow:  Room air  Physical Exam  Additional Exam:  wt 214  July 19, 2008 >  198 December 31, 2008  > 196 July 25, 2009  Ambulatory healthy appearing in no acute distress. Afeb with normal vital signs HEENT: nl dentition, turbinates, and orophanx. Nl external ear canals without cough reflex Neck without JVD/Nodes/TM Lungs clear to [name] and P bilaterally without cough on insp or exp maneuvers RRR no s3 or murmur or increase in P2 Abd soft and benign with nl excursion in  the supine position. No bruits or organomegaly Ext warm without calf tenderness, cyanosis clubbing.     Cholesterol               155 mg/dL                   5-284     ATP III Classification            Desirable:  < 200 mg/dL                    Borderline High:  200 - 239  mg/dL               High:  > = 240 mg/dL   Triglycerides             74.0 mg/dL                  1.3-244.0     Normal:  <150 mg/dL     Borderline High:  102 - 199 mg/dL   HDL                       72.53 mg/dL                 >66.44   VLDL Cholesterol          14.8 mg/dL                  0.3-47.4   LDL Cholesterol           86 mg/dL                    2-59  CHO/HDL Ratio:  CHD Risk                             3                    Men          Women     1/2 Average Risk     3.4          3.3     Average Risk          5.0          4.4     2X Average Risk          9.6          7.1     3X Average Risk          15.0          11.0                           Tests: (2) BMP (METABOL)   Sodium                    136 mEq/L                   135-145   Potassium                 5.1 mEq/L                   3.5-5.1   Chloride                  100 mEq/L  96-112   Carbon Dioxide            31 mEq/L                    19-32   Glucose                   88 mg/dL                    60-45   BUN                       18 mg/dL                    4-09   Creatinine                1.1 mg/dL                   8.1-1.9   Calcium                   9.5 mg/dL                   1.4-78.2   GFR                       68.98 mL/min                >60  Tests: (3) CBC Platelet w/Diff (CBCD)   White Cell Count     [L]  3.9 K/uL                    4.5-10.5   Red Cell Count       [L]  3.79 Mil/uL                 4.22-5.81   Hemoglobin                13.1 g/dL                   95.6-21.3   Hematocrit           [L]  38.2 %                      39.0-52.0   MCV                  [H]  101.0 fl                    78.0-100.0   MCHC                      34.1 g/dL                   08.6-57.8   RDW                       13.2 %                      11.5-14.6   Platelet Count            201.0 K/uL                  150.0-400.0   Neutrophil %              61.7 %  43.0-77.0   Lymphocyte %               21.7 %                      12.0-46.0   Monocyte %                8.1 %                       3.0-12.0   Eosinophils%         [H]  7.7 %                       0.0-5.0   Basophils %               0.8 %                       0.0-3.0   Neutrophill Absolute      2.5 K/uL                    1.4-7.7   Lymphocyte Absolute       0.8 K/uL                    0.7-4.0   Monocyte Absolute         0.3 K/uL                    0.1-1.0  Eosinophils, Absolute                             0.3 K/uL                    0.0-0.7   Basophils Absolute        0.0 K/uL                    0.0-0.1  Tests: (4) TSH (TSH)   FastTSH                   2.45 uIU/mL                 0.35-5.50  Tests: (5) Hepatic/Liver Function Panel (HEPATIC)   Total Bilirubin           1.0 mg/dL                   1.6-1.0   Direct Bilirubin          0.1 mg/dL                   9.6-0.4   Alkaline Phosphatase      53 U/L                      39-117   AST                       20 U/L                      0-37   ALT                       19 U/L  0-53   Total Protein             6.8 g/dL                    2.7-2.5   Albumin                   4.0 g/dL                    3.6-6.4  Tests: (6) UDip w/Micro (URINE)   Color                     YELLOW       RANGE:  Yellow;Lt. Yellow   Clarity                   CLEAR                       Clear   Specific Gravity          1.015                       1.000 - 1.030   Urine Ph                  7.0                         5.0-8.0   Protein                   30                          Negative   Urine Glucose             NEGATIVE                    Negative   Ketones                   NEGATIVE                    Negative   Urine Bilirubin           NEGATIVE                    Negative   Blood                     MODERATE                    Negative   Urobilinogen              0.2                         0.0 - 1.0   Leukocyte Esterace        NEGATIVE                    Negative    Nitrite                   NEGATIVE                    Negative   Urine WBC                 0-2/hpf  0-2/hpf   Urine RBC                 3-6/hpf                     0-2/hpf   Urine Mucus               Presence of                 None  EKG  Procedure date:  07/25/2009  Findings:      nsr/  wnl  Impression & Recommendations:  Problem # 1:  HYPERTENSION (ICD-401.9) Not ideal but well beta blocked so add low dose hctz  The following medications were removed from the medication list:    Atenolol 50 Mg Tabs (Atenolol) .Marland Kitchen... 1/2 tab once daily His updated medication list for this problem includes:    Atenolol-chlorthalidone 50-25 Mg Tabs (Atenolol-chlorthalidone) ..... One half daily  Orders: EKG w/ Interpretation (93000) TLB-BMP (Basic Metabolic Panel-BMET) (80048-METABOL) TLB-CBC Platelet - w/Differential (85025-CBCD) TLB-Hepatic/Liver Function Pnl (80076-HEPATIC) TLB-Udip ONLY (81003-UDIP) Est. Patient Level IV (78295)  Problem # 2:  HYPOTHYROIDISM (ICD-244.9)  His updated medication list for this problem includes:    Synthroid 75 Mcg Tabs (Levothyroxine sodium) .Marland Kitchen... 1 by mouth once daily  Orders: TLB-TSH (Thyroid Stimulating Hormone) (84443-TSH) Est. Patient Level IV (62130)  Labs Reviewed: TSH: 2.28 (07/19/2008)   >   2.45    July 25, 2009  Chol: 104 (07/19/2008)   HDL: 44.5 (07/19/2008)   LDL: 42 (07/19/2008)   TG: 89 (07/19/2008)  Problem # 3:  HYPERLIPIDEMIA (ICD-272.4)  His updated medication list for this problem includes:    Zocor 40 Mg Tabs (Simvastatin) .Marland Kitchen... Take 1 tablet by mouth once [name] day  Orders: TLB-Lipid Panel (80061-LIPID) Est. Patient Level IV (86578)  Labs Reviewed: SGOT: 20 (07/19/2008)   SGPT: 23 (07/19/2008)   HDL:44.5 (07/19/2008), 55.2 (07/21/2007)  LDL:42 (07/19/2008), 51 (07/21/2007)   > 86 July 25, 2009 reasonable control Chol:104 (07/19/2008), 124 (07/21/2007)  Trig:89 (07/19/2008), 88 (07/21/2007)  Problem #  4:  SARCOMA (ICD-199.1) per [facility] oncology  Medications Added to Medication List This Visit: 1)  Atenolol-chlorthalidone 50-25 Mg Tabs (Atenolol-chlorthalidone) .... One half daily  Patient Instructions: 1)  Call [phone] for your results w/in next 3 days - if there's something important  I feel you need to know,  I'll be in touch with you directly.  2)  Change Atenolol to Atenolol/hcz 50/12.5 one-half daily Prescriptions: ATENOLOL-CHLORTHALIDONE 50-25 MG TABS (ATENOLOL-CHLORTHALIDONE) one half daily  #45 x 3   Entered and Authorized by:   Nyoka Cowden MD   Signed by:   Nyoka Cowden MD on 07/25/2009   Method used:   Electronically to        [facility] [address] [facility]* (retail)       [address]       [place], [place]  [address]       Ph: [phone] or [phone]       Fax: [phone]   RxID:   [id number]    CardioPerfect ECG  ID: [id number] Patient: [name], [name] DOB: 21-Mar-1930 Age: 50 Years Old Sex: Male Race: White Height: 71 Weight: 196.13 Status: Unconfirmed Past Medical History:  MICROSCOPIC HEMATURIA (ICD-599.72)...................................................Marland KitchenWrenn HYPERTENSIVE CARDIOVASCULAR DISEASE, BENIGN (ICD-402.10) ONYCHOMYCOSIS (ICD-110.1) DIVERTICULOSIS.........................................................................................Marland KitchenRussella [name]       - See colonscopy 9/01 HYPERLIPIDEMIA (ICD-272.4)      - Target LDL <  80 (hbp, male, pos fm hx/ pos mrangiogram 2/05 ascvd SLEEP APNEA, OBSTRUCTIVE (ICD-327.23) OBESITY (ICD-278.00)     -  Target wt  =   208 for BMI < 30  HYPOTHYROIDISM (ICD-244.9) SEIZURE DISORDER (ICD-780.39) CARDIOVASCULAR DISEASE (ICD-429.2) HYPERTENSION (ICD-401.9) Sarcoma left leg........................................................................................................Marland KitchenWard, [facility])      - 07/14/08 sarcoma removal left femur HEALTH  MAINTENANCE.............................................................................................Marland KitchenWert      - Pneumovax 04/1999      - DT 10/07      - CPX  July 19, 2008   Recorded: 07/25/2009 08:57 AM P/PR: 123 ms / 205 ms - Heart rate (maximum exercise) QRS: 82 QT/QTc/QTd: 402 ms / 406 ms / 62 ms - Heart rate (maximum exercise)  P/QRS/T axis: 42 deg / 30 deg / 58 deg - Heart rate (maximum exercise)  Heartrate: 62 bpm  Interpretation:  nsr/  wnl

## 2010-08-17 NOTE — Letter (Signed)
Summary: [facility]  [facility]   Imported By: Lester Person 03/08/2010 10:07:07  _____________________________________________________________________  External Attachment:    Type:   Image     Comment:   External Document

## 2010-08-17 NOTE — Progress Notes (Signed)
Summary: [name] x 1- pt return call  Phone Note Call from Patient Call back at [facility] Phone [phone]   Caller: Patient Call For: [name] Summary of Call: PT NEEDS A HFU W/ DR Sherene Sires THIS WK OR EARLY NEXT WK. HE WAS D/C'D FROM HOSP. YESTERDAY AND HE WANTS TO SEE DR Sherene Sires (DISCHARGE PAPERS STATE HE NEEDS HFU W/ IN 1 WK OF DISCHARGE. (I OFFERED A HFU W/ TP BUT PT WANTS DR YNWG) Initial call taken by: Tivis Ringer, CNA,  May 30, 2010 11:54 AM  Follow-up for Phone Call        [facility] [name]  May 30, 2010 12:00 PM  pt return call. Valinda Hoar  May 30, 2010 12:48 PM     Additional Follow-up for Phone Call Additional follow up Details #1::        Spoke with pt and sched HFU for 06/05/10 at 9:40 am.  Pt informed to bring all active meds in hand. Additional Follow-up by: [name],  May 30, 2010 1:31 PM

## 2010-08-17 NOTE — Letter (Signed)
Summary: [facility]   Imported By: Sherian Rein 07/28/2010 07:30:02  _____________________________________________________________________  External Attachment:    Type:   Image     Comment:   External Document

## 2010-08-17 NOTE — Assessment & Plan Note (Signed)
Summary: Primary svc/ eval memory loss   Primary Provider/Referring Provider:  Sherene Sires  CC:  Acute visit.  Pt's spouse and children are concerned that pt is depressed- "less engaged" then he used to be.  Spouse states that he spends most of the dsay resting.  He also has been more irritable. Family has noticed memory loss over the past 6 months.  He also c/o some hearing loss. Marland Kitchen  History of Present Illness: 77 yowm with  history of hypertenision, hyperlipidemia, and hypothyroidism.    July 19, 2008 cpx recovering from sarcoma surgery at [facility] and has been referred to Oncology and planning to start chemo with extensive labs and xrays there in 12/09.    December 30, 2008 ov for eval of orthostatic lightheadedness resolved off amlodipine for recheck bp doing well now.  rec stop amlodipine and no more spells ok self monitoring  July 25, 2009 ov fastin for recheck bp/lipids/hypothyroid.  Energy level good, bp a bit high but no symptoms of ha, dizziness.   February 14, 2010 Acute visit.  Pt's spouse and children are concerned that pt is depressed- "less engaged" then he used to be.  Spouse states that he spends most of the day resting.  He also has been more irritable. Family has noticed memory loss over the past 6 months.  He also c/o some hearing loss but is not concerned re their perceived concerns.  Pt denies any significant sore throat, dysphagia, itching, sneezing,  nasal congestion or excess secretions,  fever, chills, sweats, unintended wt loss, pleuritic or exertional cp, hempoptysis, change in activity tolerance  orthopnea pnd or leg swelling    Current Medications (verified): 1)  Zocor 40 Mg Tabs (Simvastatin) .... Take 1 Tablet By Mouth Once A Day 2)  Synthroid 75 Mcg  Tabs (Levothyroxine Sodium) .Marland Kitchen.. 1 By Mouth Once Daily 3)  Multi Vitamins .... Once Daily 4)  Warfarin Sodium 1 Mg Tabs (Warfarin Sodium) .Marland Kitchen.. 1 Once Daily 5)  Tylenol 500mg  .... As Needed 6)  Advil 200 Mg Tabs (Ibuprofen)  .Marland Kitchen.. 1-4 With Meal (Up To 12 Daily) 7)  Atenolol-Chlorthalidone 50-25 Mg Tabs (Atenolol-Chlorthalidone) .... One Half Daily  Allergies (verified): No Known Drug Allergies  Past History:  Past Medical History: MICROSCOPIC HEMATURIA (ICD-599.72)...................................................Marland KitchenWrenn HYPERTENSIVE CARDIOVASCULAR DISEASE, BENIGN (ICD-402.10) ONYCHOMYCOSIS (ICD-110.1) DIVERTICULOSIS.........................................................................................Marland KitchenRussella Dar       - See colonscopy 03/2000 HYPERLIPIDEMIA (ICD-272.4)      - Target LDL < 70 (hbp, male, pos fm hx/ pos mrangiogram 2/05 ascvd SLEEP APNEA, OBSTRUCTIVE (ICD-327.23) OBESITY (ICD-278.00)     -  Target wt  =   208 for BMI < 30  HYPOTHYROIDISM (ICD-244.9) SEIZURE DISORDER (ICD-780.39) CARDIOVASCULAR DISEASE (ICD-429.2) HYPERTENSION (ICD-401.9) Sarcoma left leg........................................................................................................Marland KitchenWard, [facility])      - 07/14/08 sarcoma removal left femur >  last rx July 2010 > CR by scans 07/2009      - Chemo complete 12/2008 Memory Loss indolent onset 20111      - CT Head February 14, 2010 >>>      - [facility] rec February 14, 2010 >> HEALTH MAINTENANCE.............................................................................................Marland KitchenWert      - Pneumovax 04/1999      - DT 10/07      - CPX   July 25, 2009   Vital Signs:  Patient profile:   77 year old male Weight:      199.38 pounds BMI:     27.91 O2 Sat:      97 % on Room air Temp:  97.7 degrees F oral Pulse rate:   71 / minute BP sitting:   126 / 80  (left arm)  Vitals Entered By: [name] (February 14, 2010 3:19 PM)  O2 Flow:  Room air  Physical Exam  Additional Exam:  wt 214  July 19, 2008 >  198 December 31, 2008  > 196 July 25, 2009 > 199 February 14, 2010  Ambulatory healthy appearing in no acute distress. HEENT: nl dentition, turbinates, and  orophanx. Nl external ear canals without cough reflex/ grossly nl hearing Neck without JVD/Nodes/TM Lungs clear to A and P bilaterally without cough on insp or exp maneuvers RRR no s3 or murmur or increase in P2 Abd soft and benign with nl excursion in the supine position. No bruits or organomegaly Ext warm without calf tenderness, cyanosis clubbing.   Neuro alert, 0x4, good short and longterm recall.  Pos PM reflex subtle bilaterally   Sodium               [L]  130 mEq/L                   135-145   Potassium                 4.4 mEq/L                   3.5-5.1   Chloride             [L]  94 mEq/L                    96-112   Carbon Dioxide            30 mEq/L                    19-32   Glucose                   92 mg/dL                    11-91   BUN                       19 mg/dL                    4-78   Creatinine                1.2 mg/dL                   2.9-5.6   Calcium                   9.4 mg/dL                   2.1-30.8   GFR                       62.90 mL/min                >60  Tests: (2) CBC Platelet w/Diff (CBCD)   White Cell Count          6.2 K/uL                    4.5-10.5   Red Cell Count       [L]  3.71 Mil/uL  4.22-5.81   Hemoglobin                13.0 g/dL                   18.8-41.6   Hematocrit           [L]  37.8 %                      39.0-52.0   MCV                  [H]  101.8 fl                    78.0-100.0   MCHC                      34.4 g/dL                   60.6-30.1   RDW                       13.1 %                      11.5-14.6   Platelet Count            229.0 K/uL                  150.0-400.0   Neutrophil %              62.4 %                      43.0-77.0   Lymphocyte %              25.7 %                      12.0-46.0   Monocyte %                7.7 %                       3.0-12.0   Eosinophils%              3.8 %                       0.0-5.0   Basophils %               0.4 %                       0.0-3.0   Neutrophill  Absolute      3.9 K/uL                    1.4-7.7   Lymphocyte Absolute       1.6 K/uL                    0.7-4.0   Monocyte Absolute         0.5 K/uL                    0.1-1.0  Eosinophils, Absolute                             0.2 K/uL  0.0-0.7   Basophils Absolute        0.0 K/uL                    0.0-0.1  Tests: (3) TSH (TSH)   FastTSH                   3.12 uIU/mL                 0.35-5.50  Impression & Recommendations:  Problem # 1:  MEMORY LOSS (ICD-780.93)  No obvious source but has had chemo and needs head ct to r/o mets plus [facility]  Problem # 2:  HYPERTENSIVE CARDIOVASCULAR DISEASE, BENIGN (ICD-402.10)  His updated medication list for this problem includes:    Atenolol-chlorthalidone 50-25 Mg Tabs (Atenolol-chlorthalidone) ..... One half daily  ok on rx, doubt any relation to hbp rx  Orders: Est. Patient Level IV (04540)  Other Orders: Misc. Referral (Misc. Ref) TLB-BMP (Basic Metabolic Panel-BMET) (80048-METABOL) TLB-CBC Platelet - w/Differential (85025-CBCD) TLB-TSH (Thyroid Stimulating Hormone) (98119-JYN)  Patient Instructions: 1)  See Tammy NP w/in 2 weeks  for memory testing 2)  See Patient Care Coordinator before leaving for head ct

## 2010-08-29 ENCOUNTER — Telehealth: Payer: Self-pay | Admitting: Internal Medicine

## 2010-09-05 NOTE — H&P (Signed)
NAMELYNDOL, VANDERHEIDEN               ACCOUNT NO.:  [id number]  MEDICAL RECORD NO.:  [id number]          PATIENT TYPE:  INP  LOCATION:  3715                         FACILITY:  [facility]  PHYSICIAN:  Lucile Crater, MD         DATE OF BIRTH:  [birth year]  DATE OF ADMISSION:  05/26/2010 DATE OF DISCHARGE:                             HISTORY & PHYSICAL   PRIMARY CARE PHYSICIAN:  Dr. Charlaine Dalton. [name], pulmonology.  CHIEF COMPLAINT:  Dizziness.  HISTORY OF PRESENT ILLNESS:  The patient is a pleasant 77 year old Caucasian male with a history of TIA in 2005 with an almost normal workup except an old lacunar infarct in the carotid nucleus.  He was at church today helping out with a funeral and he noticed that he was not feeling well and he had some dizziness.  He tried to sit down in the church but thought he might need some fresh air and so was walking outside.  When he was outside on the sidewalk the symptoms worsen and actually he tried to set himself down and he was helped with two people from the church.  Reportedly, he lost consciousness for a few minutes and when he woke up he did not recognize his friends from church.  He did not have any loss of motor or sensory function.  But, there might have been an involuntary bowel movement.  But, the patient states that he has had diarrhea earlier this morning too.  There was no witnessed seizure.  And, 9-1-1 was called and when the EMS got there they did a fingerstick glucose and reportedly it was normal.  The patient states that he had blurred vision.  Denies having any headaches or nausea or vomiting.  He denies having any chest pain or shortness of breath or palpitations.  REVIEW OF SYSTEMS:  A complete review of systems was done which include general, cardiovascular, surgery, GI, GU, endocrine, musculoskeletal, neurologic and psychiatric all within normal limits.  PAST MEDICAL HISTORY: 1. Hypertension. 2. Hyperlipidemia. 3. TIA. 4.  Microvascular cardiovascular disease. 5. Hypothyroidism. 6. Seizure is questionable secondary to CVA. 7. Cervical spondylosis. 8. Obesity. 9. Diverticulosis. 10.Onychomycosis. 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  He     follows up at the [facility].     The last chemo was 6 months ago.  He had a Port-A-Cath placed for     this. 12.Chronic anticoagulation on Coumadin secondary to the Port-A-Cath     placement. 13.Olecranon bursitis.  ALLERGIES:  None.  There is a mention of him being intolerant to statin, but he is now on Zocor with no complaints.  CURRENT MEDICATIONS:  He could not provide the doses of the medications. 1. Atenolol. 2. Synthroid. 3. Zocor. 4. Coumadin.  SOCIAL HISTORY:  There is remote history of tobacco abuse.  He quit smoking 30 years ago.  He likes to have 2 glasses of wine a day.  No history of illicit drugs.  He currently lives in [place] with his wife and is retired.  FAMILY HISTORY:  Noncontributory.  PHYSICAL EXAM:  VITALS:  T-max 98.3, pulse rate 80, respiratory 19, blood pressure 131/68.  O2 sats 97% on room air. GENERAL APPEARANCE:  Not in acute distress, alert, awake and oriented times 3.  Afebrile. HEENT: Normocephalic, atraumatic.  Pupils are equal and reactive to light and accommodation.  Extraocular muscles are intact.  Mucous membranes are moist.  The pupils are equally reactive to accommodation. Extraocular muscles are intact. NECK:  Supple.  No JVD, lymphadenopathy or carotid bruit. CVS: Regular rhythm.  No murmurs, rubs or gallops. LUNGS:  Lungs are clear to auscultation bilaterally. EXTREMITIES:  No clubbing, cyanosis or edema. ABDOMEN:  Abdomen is benign. NEUROLOGICAL EXAM:  5/5 strength throughout.  DTRs 2+ bilaterally. Sensation is intact.  Cranial nerves II-XII are intact.  Finger-to-nose test within normal limits.  Babinski's normal.  LABS AND STUDIES:  Chest  x-ray two-view dated May 26, 2010:  Acute abnormality.  CBC with diff:  WBC 9800, hemoglobin 12.8, hematocrit 33, platelets 168, neutrophils 92%.  Sodium 129, potassium 4.2, chloride 95, bicarb 25, BUN 20, creatinine 1.41, blood glucose of 132. Total protein 6.8, albumin 4.1, ALT 22, AST 25, alkaline phosphatase 64, total bili 0.7.  Cardiac enzymes are negative.  Urinalysis within normal other than 11-20 per high-powered field.  ASSESSMENT/PLAN: 1. Syncope.  There are multiple possibilities including transient     ischemic attack versus arrhythmia versus hyponatremia amongst     others.  We will get a CT scan of the head given his being     anticoagulated with Coumadin.  He is subtherapeutic but still has     addressed.  We will follow up on results of the CT scan.  We will     also get an MRI, MRA of the brain and also an MRA of the neck.     Will get a 2-D echocardiogram as well.  Will continue statin.  We     will not put him on aspirin until we know that the CT scan is     negative for any acute bleed. 2. Cerebrovascular disease.  Aggressive risk factor modification.     Will treat the blood pressure, hyperlipidemia.  Will continue     statin.  3.  Hyponatremia, etiology unclear, probably he has low     sodium chronically.  Will have a BMP q.6 h.  Will have him on     normal saline 125 cc an hour.  We will not acutely correct the     hyponatremia.  Will try to bring it back closely to normal. 3. Hypertension, permissive will not be aggressive unless the systolic     is greater than 210 and diastolic is greater than 120.  Aggressive     control of blood pressure has the high risk of hypoperfusion of the     brain. 4. Hyperlipidemia.  Will check a fasting lipid panel.  Will continue     the statin. 5. Hypothyroidism.  Will check a TSH.  They are going to get the doses     of the home medications tomorrow.  Will restart on home meds. 6. Sarcoma. The patient is following up at  [facility].  We will continue to follow up with them. 7. Diverticulosis.  No signs of active bleeding. 8. Obstructive sleep apnea.  He has no signs of poly-somnolence.  He     will continue to follow up with pulmonology. 9. DVT prophylaxis with SCDs. 10.Code status is full. 11.Will admit  the patient to the tele floor for further evaluation and     management.     Lucile Crater, MD     TA/MEDQ  D:  05/26/2010  T:  05/26/2010  Job:  [id number]  cc:   Charlaine Dalton. Sherene Sires, MD, [facility]  Electronically Signed by Lucile Crater MD on 09/05/2010 04:02:55 PM

## 2010-09-06 NOTE — Progress Notes (Signed)
  Phone Note Refill Request Message from:  Fax from Pharmacy  Refills Requested: Medication #1:  LOSARTAN POTASSIUM 50 MG TABS 1 once daily   Supply Requested: 1 month Initial call taken by: Zackery Barefoot CMA,  August 29, 2010 9:56 AM    Prescriptions: LOSARTAN POTASSIUM 50 MG TABS (LOSARTAN POTASSIUM) 1 once daily  #30 x 3   Entered by:   Zackery Barefoot CMA   Authorized by:   Nyoka Cowden MD   Signed by:   Zackery Barefoot CMA on 08/29/2010   Method used:   Electronically to        [id number] (retail)       [address]       [place], [place]  [address]       Ph: [phone]       Fax: [phone]   RxID:   [id number]

## 2010-09-26 LAB — BASIC METABOLIC PANEL
BUN: 24 mg/dL — ABNORMAL HIGH (ref 6–23)
CO2: 28 mEq/L (ref 19–32)
Calcium: 8.1 mg/dL — ABNORMAL LOW (ref 8.4–10.5)
Calcium: 9 mg/dL (ref 8.4–10.5)
Calcium: 9 mg/dL (ref 8.4–10.5)
Calcium: 9.3 mg/dL (ref 8.4–10.5)
Chloride: 91 mEq/L — ABNORMAL LOW (ref 96–112)
Chloride: 95 mEq/L — ABNORMAL LOW (ref 96–112)
Creatinine, Ser: 1.07 mg/dL (ref 0.4–1.5)
Creatinine, Ser: 1.19 mg/dL (ref 0.4–1.5)
Creatinine, Ser: 1.39 mg/dL (ref 0.4–1.5)
GFR calc Af Amer: >60 mL/min (ref >60)
GFR calc Af Amer: >60 mL/min (ref >60)
GFR calc Af Amer: >60 mL/min (ref >60)
GFR calc non Af Amer: >60 mL/min (ref >60)
GFR calc non Af Amer: >60 mL/min (ref >60)
Glucose, Bld: 117 mg/dL — ABNORMAL HIGH (ref 70–99)
Glucose, Bld: 80 mg/dL (ref 70–99)
Sodium: 128 mEq/L — ABNORMAL LOW (ref 135–145)
Sodium: 133 mEq/L — ABNORMAL LOW (ref 135–145)

## 2010-09-26 LAB — DIFFERENTIAL
Eosinophils Relative: 1 % (ref 0–5)
Lymphocytes Relative: 12 % (ref 12–46)
Lymphocytes Relative: 2 % — ABNORMAL LOW (ref 12–46)
Lymphs Abs: 0.8 10*3/uL (ref 0.7–4.0)
Monocytes Absolute: 0.4 10*3/uL (ref 0.1–1.0)
Monocytes Absolute: 0.4 10*3/uL (ref 0.1–1.0)
Monocytes Relative: 4 % (ref 3–12)
Monocytes Relative: 7 % (ref 3–12)
Neutro Abs: 9 10*3/uL — ABNORMAL HIGH (ref 1.7–7.7)

## 2010-09-26 LAB — CBC
HCT: 33.5 % — ABNORMAL LOW (ref 39.0–52.0)
Hemoglobin: 11.6 g/dL — ABNORMAL LOW (ref 13.0–17.0)
MCH: 33 pg (ref 26.0–34.0)
MCH: 34 pg (ref 26.0–34.0)
MCV: 97.9 fL (ref 78.0–100.0)
MCV: 98.2 fL (ref 78.0–100.0)
Platelets: 168 10*3/uL (ref 150–400)
Platelets: 173 10*3/uL (ref 150–400)
Platelets: 178 10*3/uL (ref 150–400)
RBC: 3.41 MIL/uL — ABNORMAL LOW (ref 4.22–5.81)
RBC: 3.61 MIL/uL — ABNORMAL LOW (ref 4.22–5.81)
RDW: 13.1 % (ref 11.5–15.5)
WBC: 5 10*3/uL (ref 4.0–10.5)
WBC: 6.3 10*3/uL (ref 4.0–10.5)
WBC: 9.8 10*3/uL (ref 4.0–10.5)

## 2010-09-26 LAB — CARDIAC PANEL(CRET KIN+CKTOT+MB+TROPI)
Relative Index: 1.2 (ref 0.0–2.5)
Total CK: 129 U/L (ref 7–232)
Troponin I: 0.02 ng/mL (ref 0.00–0.06)

## 2010-09-26 LAB — URINALYSIS, ROUTINE W REFLEX MICROSCOPIC
Bilirubin Urine: NEGATIVE
Nitrite: NEGATIVE
Specific Gravity, Urine: 1.02 (ref 1.005–1.030)
Urobilinogen, UA: 0.2 mg/dL (ref 0.0–1.0)
pH: 6.5 (ref 5.0–8.0)

## 2010-09-26 LAB — HEPATIC FUNCTION PANEL
ALT: 19 U/L (ref 0–53)
Alkaline Phosphatase: 53 U/L (ref 39–117)
Indirect Bilirubin: 0.4 mg/dL (ref 0.3–0.9)
Total Bilirubin: 0.5 mg/dL (ref 0.3–1.2)
Total Protein: 5.6 g/dL — ABNORMAL LOW (ref 6.0–8.3)

## 2010-09-26 LAB — URINE MICROSCOPIC-ADD ON

## 2010-09-26 LAB — PROTIME-INR
INR: 0.98 (ref 0.00–1.49)
INR: 1.02 (ref 0.00–1.49)
Prothrombin Time: 13.2 seconds (ref 11.6–15.2)
Prothrombin Time: 13.6 seconds (ref 11.6–15.2)

## 2010-09-26 LAB — APTT: aPTT: 32 seconds (ref 24–37)

## 2010-09-26 LAB — CK TOTAL AND CKMB (NOT AT ARMC)
CK, MB: 2.6 ng/mL (ref 0.3–4.0)
Relative Index: 1.6 (ref 0.0–2.5)

## 2010-09-26 LAB — COMPREHENSIVE METABOLIC PANEL
Albumin: 4.1 g/dL (ref 3.5–5.2)
BUN: 22 mg/dL (ref 6–23)
Creatinine, Ser: 1.41 mg/dL (ref 0.4–1.5)
Potassium: 4.2 mEq/L (ref 3.5–5.1)
Total Protein: 6.8 g/dL (ref 6.0–8.3)

## 2010-09-26 LAB — LIPID PANEL
Triglycerides: 79 mg/dL (ref <150)
VLDL: 16 mg/dL (ref 0–40)

## 2010-09-26 LAB — HEMOGLOBIN A1C: Hgb A1c MFr Bld: 6 % — ABNORMAL HIGH (ref <5.7)

## 2010-09-26 LAB — URINE CULTURE

## 2010-09-26 LAB — POCT CARDIAC MARKERS
CKMB, poc: 1.6 ng/mL (ref 1.0–8.0)
CKMB, poc: <1 ng/mL — ABNORMAL LOW (ref 1.0–8.0)
Troponin i, poc: <0.05 ng/mL (ref 0.00–0.09)
Troponin i, poc: <0.05 ng/mL (ref 0.00–0.09)

## 2010-10-09 ENCOUNTER — Other Ambulatory Visit: Payer: Self-pay | Admitting: *Deleted

## 2010-10-09 MED ORDER — ATENOLOL 25 MG PO TABS: 25.0000 mg | ORAL_TABLET | Freq: Every day | ORAL | Status: DC

## 2010-12-01 NOTE — Consult Note (Signed)
[name], [name]                           ACCOUNT NO.:  [id number]   MEDICAL RECORD NO.:  [id number]                   PATIENT TYPE:  INP   LOCATION:  3036                                 FACILITY:  [facility]   PHYSICIAN:  Charlaine Dalton. Sherene Sires, M.D. [facility]           DATE OF BIRTH:  08-31-1929   DATE OF CONSULTATION:  09/13/2003  DATE OF DISCHARGE:                                   CONSULTATION   PULMONARY MEDICINE CONSULTATION   REASON FOR CONSULTATION:  Evaluation of hypertension.   HISTORY OF PRESENT ILLNESS:  The patient is a 77 year old white male remote  smoker whom I follow for primary care for evaluation for hypertension.  He  had developed intermittent episodes of left arm numbness on August 29, 2003 and was seen at the [facility] emergency room with a negative work up  and seen here by my nurse practitioner on August 31, 2003 with no definite  signs of neurologic problems with blood pressure 158/82 then.  He denied any  headache, nausea and stated that the numbness came and went without any real  pattern on an aspirin daily but the numbness was circumferential to the  level of the shoulder and associated with some difficulty using his hand  because it felt swollen and tingling.  I recommended that he be started on  Plavix and since the Plavix was started he has had no more pain.  However,  he was sitting in church yesterday and had an episode of what was described  by a physician sitting next to him as a classic seizure and Dr. Vickey Huger has  admitted him for seizure work up.  He has not had any previous history of  any seizures and his admission scans, as I understand them, did not show any  obvious lesions.  An MRA, however, is pending at the time of this dictation.   The patient states he is feeling back to normal, however, his wife says that  normal includes chronic fatigue that has worsened over the last year and  she has noticed in retrospect also obvious sleep apnea  that is occurring  nocturnally.  He denies, however, any excessive hypersomnolence or morning  headaches.  The patient has no symptoms of any worsening arm symptoms with  exercise or significant leg swelling or other neurologic or circulatory  complaints.   PAST MEDICAL HISTORY:  1. Hypertension.  2. Hypothyroidism.  3. Obesity with target weight of 186.  4. Diverticulosis status post most recent colonoscopy September 2001.  5. Hyperlipidemia with target LDL less than 130 based on hypertension as his     major risk factor.  His most recent LDL cholesterol was 153 on May 10, 2003.  6. Patient INTOLERANT TO STATINS because of cramps.  Patient also has     positive risk factors with heart disease in his father.  7. Hematuria with negative work  up by Dr. Excell Seltzer. [name] in November 2001.  8. Onychomycosis.   ALLERGIES:  ASPIRIN has been listed on his records but apparently is  incorrect and he has not been able to tolerate it. The patient directly  contradicts this saying he has been able to take this without any  difficulty.   MEDICATIONS:  1. Synthroid 50 mcg daily.  2. Atenolol 50 mg tablets daily.  3. Baby aspirin daily.  4. Plavix 75 mg one daily, started on August 31, 2003.   SOCIAL HISTORY:  He rarely drinks alcohol.  He quit smoking over 20 years  ago.  He is retired now for about five years.  He has had trouble with  weight gain since retirement attributed to inactivity.   FAMILY HISTORY:  Positive for ischemic heart disease in his father in his  68's who was a smoker.  Mother died of cancer of the breast.  He has one  brother who is still healthy.   REVIEW OF SYMPTOMS:  Completed detailed work sheet essentially negative  except as noted above.   PHYSICAL EXAMINATION:  GENERAL:  This is a robust, white male who appears  alert, cooperative and in no acute distress.  VITAL SIGNS:  He is afebrile.  Blood pressure here 140/80.  HEENT:  Unremarkable.  Oropharynx  clear.  Dentition is intact.  NECK:  Supple without cervical adenopathy or tenderness.  Trachea is  midline.  There is no thyromegaly.  Carotid upstrokes are brisk without  bruits.  CHEST:  Completely clear bilaterally, clear to auscultation and percussion.  HEART:  Regular rate and rhythm without murmurs, rubs or gallops.  ABDOMEN:  Soft, benign.  EXTREMITIES:  Without tenderness, cyanosis, clubbing or edema.  NEUROLOGICAL:  No focal deficits. Pathologic reflexes.  Skin examination is  warm and dry.   LABORATORY DATA:  BMET showed a sodium of 124, sed rate 5.  Most recent  sodium in our charts was 134 on August 09, 2002.   IMPRESSION:  1. New onset seizures, unclear whether the sodium level noted has anything     to do with the seizure threshold or not but he has not had any     recurrence.  2. History strongly suggests transient ischemic attacks involving the left     upper extremity, possibly of thalamic origin since he has circumferential     numbness of the entire left arm.  I understand his MRI and CT scan show     no evidence of definite stroke although this would be probably a fairly     small area and MRA scan is pending.  The issue for now is whether to     continue him on both aspirin and Plavix or not.  3. Intermittent cramps that have been attributed to statin therapy that have     improved.  4. Hypertension adequately controlled on his present regimen, however,     because of his hyponatremia will need to stop his Maxzide at this point     and perhaps add a vasodilator in the form of Norvasc or Plendil.  5. Hyponatremia.  Note concerns this may have lowered the seizure threshold.     For now I will ask him to stop Maxzide and follow him up carefully in the     office to be sure he does not have SIADH but most likely this was due to     Maxzide therapy.  6. Obstructive sleep apnea, mild to moderate in  nature clinically.    Recommend sleep apnea study as an  outpatient.                                               Charlaine Dalton. Sherene Sires, M.D. [facility]    MBW/MEDQ  D:  09/13/2003  T:  09/13/2003  Job:  [id number]

## 2010-12-01 NOTE — Assessment & Plan Note (Signed)
[facility]                               PULMONARY OFFICE NOTE   NAME:[name], [name]                      MRN:          [id number]  DATE:05/03/2006                            DOB:          [birth year]    PRIMARY SERVICE COMPREHENSIVE HEALTH CARE EVALUATION:   HISTORY:  [name] 77 year old white male in for followup evaluation of obesity,  hypertension, hyperlipidemia.  He says he walks about 30 minutes every day  with no significant limiting dyspnea, chest pain. He denies any TIA,  claudication, orthopnea, PND, or leg swelling.   PAST MEDICAL HISTORY:  1. Olecranon bursitis on the right, followed by Dr. Amanda Pea.  2. Hypertension.  3. Microvascular cardiovascular disease.  4. Possible new onset seizure in 2005 related to an ischemic infarct.  5. Hypothyroidism.  6. Obesity with target weight of 186.  7. Obstructive sleep apnea with no evidence of hypersomnolence.  8. Hyperlipidemia.  9. Diverticulosis by colonoscopy in 2001, in computer for recall 2011.  10.Hematuria with negative workup by Dr. Bjorn Pippin in 2001.  11.Onychomycosis.   ALLERGIES:  STATINS cause cramping.   MEDICATIONS:  Taken in detail on the worksheet, correspond to the  medications he carries with him and corrected list in the column dated  May 03, 2006.   SOCIAL HISTORY:  Rarely drinks alcohol, quit smoking over 20 years ago.  He  is retired, relatively sedentary.   FAMILY HISTORY:  Positive for ischemic heart disease in his father in his  41s who was [name] smoker.  His mother died of breast cancer.  He has one brother  who is still healthy.   REVIEW OF SYSTEMS:  Taken in detail on the worksheet and essentially  negative except for occasional nocturia.  He also has occasional right wrist  pain for the last month, positional in nature, hurts to pronate and  supinate.  Denies any injury.   PHYSICAL EXAMINATION:  GENERAL: This is [name] robust, pleasant, ambulatory white  male in  no acute distress.  VITAL SIGNS: Blood pressure 120/70.  Weight is 208 pounds which is no change  from baseline versus [name] target of 186.  HEENT: Unremarkable.  Dentition is intact.  Oropharynx is clear.  NECK:  Supple without cervical adenopathy or tenderness.  Trachea is  midline.  No thyromegaly.  Carotid upstrokes are brisk bilaterally without  any bruits.  LUNGS: Lung fields were perfectly clear bilaterally to auscultation and  percussion.  There was excellent air movement.  CARDIAC: Regular rate and rhythm without murmur, gallop, or rub present.  ABDOMEN:  Soft and benign with no palpable organomegaly, masses, or  tenderness.  There were no aortic or femoral bruits.  GU: Testes descended bilaterally without any nodules.  RECTAL:  Revealed mild BPH, smooth texture, no nodules.  EXTREMITIES:  Warm without calf tenderness, cyanosis, clubbing, or edema.  He had decreased right dorsalis pedis pulse, somewhat asymmetric, otherwise  normal pulses.  NEUROLOGIC: No focal deficits or pathologic reflexes.  SKIN: Warm and dry.  MUSCULOSKELETAL: Exam did not reveal any abnormalities in the right wrist or  elbow.  LABORATORY DATA:  Chest x-ray showed minimal cardiomegaly.   EKG showed no evidence of ischemic or hypertropic change.   Urinalysis showed trace protein.  His general health profile revealed [name]  normal CBC, normal chemistry profile with [name] TSH of 2.36.  Total cholesterol  was 120 with an LDL of 58 and an HDL of 46.   IMPRESSION:  1. Hypertension, accompanied by trace proteinuria, completely controlled      on his present regimen which I did not change.  2. Hyperlipidemia with small vessel disease.  Therefore, target of less      than 100 attained on present level of Vitorin with no evidence of      adverse liver effects.  Therefore, no change in therapy.  3. Obesity that has not been addressed adequately by this patient.  I      reviewed calorie balance issues in detail with him  using the format of      [name] calorie balance sheet, emphasizing he should be short of breath but      not out of breath for at least 20 minutes of his 30 minutes daily      during his walk.  4. Hypothyroidism, adequately controlled on Synthroid.  5. Diverticulosis by colonoscopy and in computer for recall in 2011 and      emphasized importance of plenty of bran in the diet.  6. History of hematuria with continued evidence of trace rbc's in the      urine but previous negative workup by Dr. Bjorn Pippin, no further      followup felt to be needed.   HEALTH MAINTENANCE:  1. He was due tetanus today.  2. He was updated on Pneumovax in 2000.  3. He also received influenza today.   FOLLOW UP:  Every year, sooner if needed.            ______________________________  Charlaine Dalton Sherene Sires, MD, [facility]      MBW/MedQ  DD:  05/04/2006  DT:  05/06/2006  Job #:  [id number]

## 2010-12-01 NOTE — Discharge Summary (Signed)
Stephen, [name]                           ACCOUNT NO.:  [id number]   MEDICAL RECORD NO.:  [id number]                   PATIENT TYPE:  INP   LOCATION:  3036                                 FACILITY:  [facility]   PHYSICIAN:  Marlan Palau, M.D.               DATE OF BIRTH:  10-29-1929   DATE OF ADMISSION:  09/12/2003  DATE OF DISCHARGE:  09/13/2003                                 DISCHARGE SUMMARY   ADMISSION DIAGNOSES:  1. Single seizure event, etiology unknown.  2. Episodic left arm and hand dysesthesias, possible transient ischemic     attack events.  3. History of hypertension.   DISCHARGE DIAGNOSES:  1. Single seizure event, etiology unclear.  2. Left arm dysesthesias, possible transient ischemic attack.  3. Hypertension.  4. Cervical spondylosis.   PROCEDURES DURING THIS ADMISSION:  1. MRI of the brain.  2. MR angiogram.  3. Cervical spine MRI.  4. Electroencephalographic study.   COMPLICATIONS TO ABOVE PROCEDURES:  None.   HISTORY OF PRESENT ILLNESS:  [name] is a 77 year old white male, born  1930-05-21, with a history of transient episodes of left hand and arm  numbness that have been going on for about 3 weeks.  This patient has been  on aspirin and recently converted to Plavix.  The patient has been brought  into the [facility] following an episode of a seizure event that  occurred on the medications on admission.  The patient had generalized tonic-  clonic event with urinary incontinence.  No prior history of seizures noted.   PAST MEDICAL HISTORY:  Past medical history is notable for:  1. History of osteoarthritis.  2. History of left arm tingling episodes, possible TIAs.  3. History of hypertension.  4. Hypothyroidism.   MEDICATIONS PRIOR TO ADMISSION:  1. Atenolol 25 mg 1/2 tablet daily.  2. Maxzide 25/37.5 mg 1 a day.  3. The patient was just placed on Plavix 75 mg 1 a day.  4. Synthroid 0.05 mg 1 a day.   ALLERGIES:  The  patient has no known allergies.   HABITS:  Drinks alcohol on occasion, does not smoke.   COMMENT:  Please refer to history and physical dictation summary for social  history, family history, review of systems and physical examination.   LABORATORY VALUES:  Laboratory values notable for a homocysteine level of  13.89, PSA of 0.51, TSH of 0.42, hemoglobin A1c of 5.3, sodium of 124,  potassium 4.4, chloride of 90, CO2 of 27, glucose of 103, BUN of 17,  creatinine of 1.0, total bilirubin of 1.0, alkaline phosphatase of 55, SGOT  of 22, SGPT of 26, total protein of 5.9, albumin of 3.5, calcium 9.0.  INR  was 1.0 on admission.  White count of 4.9, hemoglobin of 13.5, hematocrit of  39.7, MCV of 91.9, platelets of 238,000.   EKG reveals normal sinus rhythm, normal EKG,  heart rate of 68.   EEG study done during this admission was normal.   HOSPITAL COURSE:  This patient was admitted to [facility] for  further evaluation.  The patient underwent an MRI scan of the brain showing  mild atrophy, nonspecific white matter changes with no evidence of an acute  infarct noted.  MRI of intracranial vessels shows atherosclerotic-type  changes involving the right internal carotid artery in the cavernous segment  and right middle cerebral artery branches.  No significant stenosis of the  basilar artery was noted.  The patient had MR angiogram of extracranial  vessels showing mild irregularity in the proximal aspects of the internal  carotid arteries without hemodynamically significant stenosis.  Cervical  spine MRI scan shows a degenerative disk at C5-6 level with extension  primarily to the right with some impingement upon the cord that is not  severe.  The patient has some spondylitic changes as well at the C6-7 level  with small-to-moderate-based disk protrusion.  This patient has had no  further problems during this hospitalization and was not placed on  anticonvulsant therapy.  The patient  is to be discharged to home at this  point on:   DISCHARGE MEDICATIONS:  1. Atenolol 25 mg 1/2 tablet daily.  2. Maxzide 25/37.5 mg daily.  3. Plavix 75 mg a day.  4. Synthroid 0.05 mg daily.   ACTIVITY:  The patient is not to drive a car until further notice, is to  have no strenuous activity for the next 4 to 6 weeks.   DIET:  No-added-salt diet.   FOLLOWUP:  He will follow up with Dr. Porfirio Mylar Dohmeier within 3 weeks  following discharge.   SPECIAL DISCHARGE INSTRUCTIONS:  The patient is to contact our office  immediately if any further problems arise.                                                Marlan Palau, M.D.    CKW/MEDQ  D:  09/13/2003  T:  09/14/2003  Job:  [id number]   cc:   Charlaine Dalton. Sherene Sires, M.D. [facility]   [address] [place], [place], [place] [facility]

## 2010-12-01 NOTE — Assessment & Plan Note (Signed)
[facility]                               PULMONARY OFFICE NOTE   NAME:[name], [name]                      MRN:          [id number]  DATE:04/09/2006                            DOB:          [birth year]    HISTORY OF PRESENT ILLNESS:  This 77 year old white male in for followup  evaluation of hypertension and hyperlipidemia doing well with no significant  exertional chest pain, orthopnea, PND, leg swelling or dyspnea.   For full inventory of medications,  which I reviewed in detail, please see  face sheet of April 09, 2006.   PHYSICAL EXAMINATION:  GENERAL:  [name] pleasant ambulatory white male in no  acute distress.  VITAL SIGNS:  Stable, blood pressure 136/78.  HEENT:  Unremarkable, oropharynx clear.  LUNGS:  Lung fields are clear bilaterally.  HEART:  Reveals regular rhythm without murmur, gallop or rub.  ABDOMEN:  Soft, benign.  EXTREMITIES:  Warm without calf tenderness, cyanosis, clubbing or edema.   IMPRESSION:  1. Hypertension well-controlled on present combination therapy.  2. Hyperlipidemia with most recent LDL at target; only 57 when seen on      January 23, 2005 with no evidence of adverse effect then.   I advised the patient that rather than just treat hypertension and high  cholesterol in the office since I am listed as his primary care physician, I  would like to do [name] comprehensive physical evaluation which will incorporate  hypertension and cholesterol management in the visit. I have asked him to  schedule this by the end of the year. Of course he will need to be fasting  at that point to check on fasting lipid profile and blood sugars.                                   Charlaine Dalton. Sherene Sires, MD, [facility]   MBW/MedQ  DD:  04/09/2006  DT:  04/11/2006  Job #:  [id number]

## 2010-12-01 NOTE — Procedures (Signed)
HISTORY:  The patient is a 77 year old who is being evaluated for a seizure  event that occurred in church.  The patient is being evaluated for the  seizure event.  This is a routine EEG.  No skull defects are noted.   MEDICATIONS:  Ativan, Protonix, Plavix, Synthroid.   EEG CLASSIFICATION:  Normal awake and drowsy.   DESCRIPTION OF RECORDING:  The background rhythm recording consists of a  fairly well-modulated medium amplitude alpha rhythm of 8 Hz that is reactive  to eye opening and closure.  As record progresses, the patient initially is  in the waking state but at times drifts off into the drowsy state with drop  out of the background rhythm activity into the 7 Hz theta frequency slowing  seen.  The patient never clearly enters stage 2 sleep at any time.  Photic  stimulation is performed resulting in a bilateral and symmetric photic  driving response.  Hyperventilation is also performed, resulting in some  minimal buildup of background rhythm activities without significant slowing  seen.  At no time during the recording does there appear to be evidence of  spike, spike wave discharges, or evidence of focal slowing.  EKG monitor  shows no evidence of cardiac arrhythmias with a heart rate of 66.   IMPRESSION:  This is an essentially normal electroencephalogram recording in  the awake and drowsy state.  No evidence of ictal or interictal discharges  are seen.    Marlan Palau, M.D.   EAV:WUJW  D:  09/13/2003 14:17:26  T:  09/13/2003 14:49:27  Job #:  [id number]

## 2010-12-01 NOTE — Consult Note (Signed)
[name], [name]                           ACCOUNT NO.:  [id number]   MEDICAL RECORD NO.:  [id number]                   PATIENT TYPE:  EMS   LOCATION:  MINO                                 FACILITY:  [facility]   PHYSICIAN:  Melvyn Novas, M.D.               DATE OF BIRTH:  03-21-1930   DATE OF CONSULTATION:  09/12/2003  DATE OF DISCHARGE:                                   CONSULTATION   PHYSICIANS:  Admitted to Dr. Oley Balm. Sung Amabile, M.D. , critical care and  pulmonology, [facility].   This patient was presenting today after having suffered a single self-  limiting seizure with a duration of 1 minute during church service.  The  patient was seated next to Dr. Viviann Spare, a retired physician from [employer], who described the seizure as a generalized tonic-clonic  event associated with urinary incontinence but not a clear posticum.  The  patient apparently came to very quickly and was able to walk out of the  church.  The patient himself states that he has no persistent neurologic  deficits the event.  In the past, approximately six weeks to three weeks  ago, he had multiple spells of left arm and hand dysesthesias that were  interpreted as possible carpal tunnel syndrome; then, after studies which  were negative, as TIAs.  He was placed on Plavix.  This replaced his usual  daily aspirin.  The paroxysmal spells lasted between 5 and 20 minutes.  A CT  scan was obtained through the [facility] by a nurse practitioner named  Tammy, who works with Dr. Sherene Sires.  The results are not available here, but it  appears that the patient was told they show a possible abnormality.   PAST MEDICAL HISTORY:  1. Obstructive sleep apnea.  2. TIA.  3. Hypertension.  4. Hypothyroidism.   FAMILY HISTORY:  Coronary artery disease, cardiac infarction.  The father  died of an acute MI.  Mother died of breast cancer with brain metastasis and  was 40.  The patient has one younger brother,  five years his junior, who is  healthy.   SOCIAL HISTORY:  He is an ex-smoker but quit over 30 years ago.  She is not  using any drugs, excessive caffeine, and is a frequent exerciser.   MEDICATIONS:  Plavix and Synthroid as well as atenolol and Maxzide, although  the doses are not known.  He will be started on Protonix.   ALLERGIES:  No known drug allergies.   PHYSICAL EXAMINATION:  VITAL SIGNS:  Blood pressure 130/80, respiratory rate  18, heart rate 62 but has been measured before at 88 and 92.  LUNGS:  Clear to auscultation.  COR:  Regular rate and rhythm, no murmur.  There is no carotid bruit.  SKIN:  No peripheral edema, rash, or cyanosis.  There is a small bleeding  blister on his right upper  lip.  NEUROLOGIC:  Mental Status:  Alert and oriented x 3, fatigued.  The patient  denies posticum and appears to have normal memory except for the amnestic  part of the spell.   Cranial nerve exam:  Pupils equal and reactive to light and accommodation.  Extraocular movements intact.  No visual deficits.  No papiledema.  Facial  structures are symmetric.   Normal sensory.  Tongue and uvula midline.   Motor examination:  Equal strength, tone, and mass.  Deep tendon reflexes  are 1+ throughout with downgoing toes and no clonus.   Coordination:  Intact finger-to-nose test.  No tremors.  No dysmetria or  ataxia.   IMPRESSION:  The patient suffered a single seizure, perhaps __________ with  syncope.  He is a primary care patient of Dr. Sherene Sires, and this is how Dr.  Sung Amabile was called to see him.  I wrote this as a consultation, but if Dr.  Sung Amabile prefers, I will take the patient on my service.  I have ordered the  following studies, EEG, MRI with MRA, telemetry bed overnight, EKG, CK, CK-  MB, sed rate, Hemoglobin A1C, CMET, TSH, and homocysteine level.  The  patient is hopefully ready for discharge tomorrow.  I will be happy to  follow him as an outpatient.                                                Melvyn Novas, M.D.    CD/MEDQ  D:  09/12/2003  T:  09/12/2003  Job:  [id number]   cc:   Charlaine Dalton. Sherene Sires, M.D. [facility]   Oley Balm. Sung Amabile, M.D. [facility]   [facility]

## 2010-12-19 ENCOUNTER — Other Ambulatory Visit: Payer: Self-pay | Admitting: Internal Medicine

## 2011-01-21 ENCOUNTER — Other Ambulatory Visit: Payer: Self-pay | Admitting: Internal Medicine

## 2011-02-19 ENCOUNTER — Other Ambulatory Visit: Payer: Self-pay | Admitting: Internal Medicine

## 2011-02-26 ENCOUNTER — Other Ambulatory Visit: Payer: Self-pay | Admitting: Internal Medicine

## 2011-03-02 ENCOUNTER — Telehealth: Payer: Self-pay | Admitting: Internal Medicine

## 2011-03-02 NOTE — Telephone Encounter (Signed)
Pt aware rx has been sent to his pharmacy and to contact them to make sure it is ready.

## 2011-03-25 ENCOUNTER — Other Ambulatory Visit: Payer: Self-pay | Admitting: Internal Medicine

## 2011-03-28 ENCOUNTER — Telehealth: Payer: Self-pay | Admitting: Internal Medicine

## 2011-03-28 NOTE — Telephone Encounter (Signed)
Last CPX was in 09'> therefore next ov needs to be CPX and pt will just need to be fasting for this appt. LMOMTCB.

## 2011-03-29 NOTE — Telephone Encounter (Signed)
PT RETURNED CALL. HE IS AWARE THAT HE WILL NEED TO FAST FOR THIS PHYSICAL. NOTHING FURTHER NEEDE- THANKS. [name]

## 2011-04-23 ENCOUNTER — Other Ambulatory Visit: Payer: Self-pay | Admitting: Internal Medicine

## 2011-04-23 ENCOUNTER — Ambulatory Visit: Payer: Self-pay | Admitting: Internal Medicine

## 2011-04-24 ENCOUNTER — Other Ambulatory Visit: Payer: Self-pay | Admitting: *Deleted

## 2011-04-24 MED ORDER — LEVOTHYROXINE SODIUM 75 MCG PO TABS: 75.0000 ug | ORAL_TABLET | Freq: Every day | ORAL | Status: DC

## 2011-04-25 ENCOUNTER — Telehealth: Payer: Self-pay | Admitting: Internal Medicine

## 2011-04-25 ENCOUNTER — Other Ambulatory Visit: Payer: Self-pay | Admitting: Internal Medicine

## 2011-04-25 ENCOUNTER — Ambulatory Visit (INDEPENDENT_AMBULATORY_CARE_PROVIDER_SITE_OTHER): Payer: Medicare Other | Admitting: Internal Medicine

## 2011-04-25 ENCOUNTER — Encounter: Payer: Self-pay | Admitting: Internal Medicine

## 2011-04-25 ENCOUNTER — Other Ambulatory Visit (INDEPENDENT_AMBULATORY_CARE_PROVIDER_SITE_OTHER): Payer: Medicare Other

## 2011-04-25 DIAGNOSIS — K5732 Diverticulitis of large intestine without perforation or abscess without bleeding: Secondary | ICD-10-CM

## 2011-04-25 DIAGNOSIS — N259 Disorder resulting from impaired renal tubular function, unspecified: Secondary | ICD-10-CM

## 2011-04-25 DIAGNOSIS — E785 Hyperlipidemia, unspecified: Secondary | ICD-10-CM

## 2011-04-25 DIAGNOSIS — Z0000 Encounter for general adult medical examination without abnormal findings: Secondary | ICD-10-CM

## 2011-04-25 DIAGNOSIS — C801 Malignant (primary) neoplasm, unspecified: Secondary | ICD-10-CM

## 2011-04-25 DIAGNOSIS — I10 Essential (primary) hypertension: Secondary | ICD-10-CM

## 2011-04-25 DIAGNOSIS — Z23 Encounter for immunization: Secondary | ICD-10-CM

## 2011-04-25 DIAGNOSIS — E039 Hypothyroidism, unspecified: Secondary | ICD-10-CM

## 2011-04-25 DIAGNOSIS — B351 Tinea unguium: Secondary | ICD-10-CM

## 2011-04-25 DIAGNOSIS — E669 Obesity, unspecified: Secondary | ICD-10-CM

## 2011-04-25 LAB — HEPATIC FUNCTION PANEL
ALT: 21 U/L (ref 0–53)
Alkaline Phosphatase: 59 U/L (ref 39–117)
Bilirubin, Direct: 0.1 mg/dL (ref 0.0–0.3)
Total Protein: 7.2 g/dL (ref 6.0–8.3)

## 2011-04-25 LAB — BASIC METABOLIC PANEL
Calcium: 9.2 mg/dL (ref 8.4–10.5)
Creatinine, Ser: 1.3 mg/dL (ref 0.4–1.5)
GFR: 58.17 mL/min — ABNORMAL LOW (ref >60.00)

## 2011-04-25 LAB — TSH: TSH: 4.99 u[IU]/mL (ref 0.35–5.50)

## 2011-04-25 LAB — URINALYSIS, ROUTINE W REFLEX MICROSCOPIC
Ketones, ur: NEGATIVE
Specific Gravity, Urine: 1.02 (ref 1.000–1.030)
Urine Glucose: NEGATIVE
Urobilinogen, UA: 0.2 (ref 0.0–1.0)

## 2011-04-25 LAB — LIPID PANEL
Cholesterol: 154 mg/dL (ref 0–200)
LDL Cholesterol: 72 mg/dL (ref 0–99)

## 2011-04-25 MED ORDER — AMLODIPINE BESYLATE 5 MG PO TABS: 5.0000 mg | ORAL_TABLET | Freq: Every day | ORAL | Status: DC

## 2011-04-25 NOTE — Telephone Encounter (Signed)
Spoke with pt and notified of results per Dr. [name]. Pt verbalized understanding and denied any questions. 

## 2011-04-25 NOTE — Progress Notes (Signed)
Subjective:    Patient ID: [name], male    DOB: May 30, 1930, 77 y.o.   MRN: [id number]  HPI  89 yowm with history of hypertenision, hyperlipidemia, and hypothyroidism.   July 19, 2008 cpx recovering from sarcoma surgery at [facility] and has been referred to Oncology and planning to start chemo with extensive labs and xrays there in 12/09.   December 30, 2008 ov for eval of orthostatic lightheadedness resolved off amlodipine for recheck bp doing well now. rec stop amlodipine and no more spells ok self monitoring   Admit [facility] 11/11-14/2011 syncope, hyponatremia, nl echo and MRA of brain > d/c hctz      04/25/2011 f/u ov/[name] CPX some urinary freq no hesitation or urgency or nocturia.  Sleeping ok without nocturnal  or early am exacerbation  of respiratory  c/o's.  Also denies any obvious fluctuation of symptoms with weather or environmental changes or other aggravating or alleviating factors except as outlined above     Past Medical History:  MICROSCOPIC HEMATURIA (ICD-599.72)...................................................Marland KitchenWrenn  HYPERTENSIVE CARDIOVASCULAR DISEASE, BENIGN (ICD-402.10)  ONYCHOMYCOSIS (ICD-110.1)  DIVERTICULOSIS.........................................................................................Marland KitchenRussella [name]  - See colonscopy 03/2000  HYPERLIPIDEMIA (ICD-272.4)  - Target LDL < 70 (hbp, male, pos fm hx/ pos mrangiogram 2/05 ascvd  SLEEP APNEA, OBSTRUCTIVE (ICD-327.23)  OBESITY (ICD-278.00)  - Target wt = 208 for BMI < 30  HYPOTHYROIDISM (ICD-244.9)  SEIZURE DISORDER (ICD-780.39)  CARDIOVASCULAR DISEASE (ICD-429.2)  HYPERTENSION (ICD-401.9)  Sarcoma left leg........................................................................................................Marland [facility])  - 07/14/08 sarcoma removal left femur > last rx July 2010 > CR by scans 07/2009 due for repeat 05/2011 - Chemo complete 12/2008  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 No acute abnormality.  - MMSE 03/02/10 30/30 HEALTH MAINTENANCE.............................................................................................Marland KitchenWert  - Pneumovax 04/1999  - DT 04/2006  - CPX  04/25/2011     Review of Systems  Constitutional: Negative for fever, chills, diaphoresis, activity change, appetite change, fatigue and unexpected weight change.  HENT: Negative for hearing loss, ear pain, nosebleeds, congestion, sore throat, facial swelling, rhinorrhea, sneezing, mouth sores, trouble swallowing, neck pain, neck stiffness, dental problem, voice change, postnasal drip, sinus pressure, tinnitus and ear discharge.   Eyes: Negative for photophobia, discharge, itching and visual disturbance.  Respiratory: Negative for apnea, cough, choking, chest tightness, shortness of breath, wheezing and stridor.   Cardiovascular: Negative for chest pain, palpitations and leg swelling.  Gastrointestinal: Negative for nausea, vomiting, abdominal pain, constipation, blood in stool and abdominal distention.  Genitourinary: Positive for frequency. Negative for dysuria, urgency, hematuria, flank pain, decreased urine volume and difficulty urinating.  Musculoskeletal: Negative for myalgias, back pain, joint swelling, arthralgias and gait problem.  Skin: Negative for color change, pallor and rash.  Neurological: Negative for dizziness, tremors, seizures, syncope, speech difficulty, weakness, light-headedness, numbness and headaches.  Hematological: Negative for adenopathy. Does not bruise/bleed easily.  Psychiatric/Behavioral: Negative for confusion, sleep disturbance and agitation. The patient is not nervous/anxious.        Objective:   Physical Exam   wt 214 July 19, 2008 > 198 December 31, 2008 > 196 July 25, 2009 > 199 February 14, 2010 > 04/25/2011  202 Ambulatory healthy appearing in no acute distress. BP elevated as noted HEENT: nl  dentition, turbinates, and orophanx. Nl external ear canals without cough reflex/ grossly nl hearing  Neck without JVD/Nodes/TM  Lungs clear to A and P bilaterally without cough on insp or exp maneuvers  RRR no s3 or murmur or increase in  P2  Abd soft and benign with nl excursion in the supine position. No bruits or organomegaly  Ext warm without calf tenderness, cyanosis clubbing.  Neuro alert, 0x4, good short and longterm recall. Pos PM reflex subtle bilaterally GU testes down bilaterally no nodules, no ih Rectal  Mild/mod bph smooth, stool g neg    Assessment & Plan:

## 2011-04-25 NOTE — Assessment & Plan Note (Addendum)
Lab Results  Component Value Date   CREATININE 1.3 04/25/2011   CREATININE 1.1 06/05/2010   CREATININE 1.09 05/29/2010   Probably very mild  Hypertensive nephrosclerosis > Adequate control on present rx, reviewed

## 2011-04-25 NOTE — Patient Instructions (Addendum)
Add amlodipine 5mg  one each am - hold this if light headed standing  Please schedule a follow up visit in 2 months but call sooner if needed   I would defer to Medical Oncologist re what studies for surveillance for recurrence of cancer.

## 2011-04-25 NOTE — Progress Notes (Signed)
Quick Note:  Spoke with pt and notified of results per Dr. [name]. Pt verbalized understanding and denied any questions.  ______ 

## 2011-04-26 ENCOUNTER — Encounter: Payer: Self-pay | Admitting: Internal Medicine

## 2011-04-26 NOTE — Assessment & Plan Note (Signed)
Adequate control on present rx, reviewed  

## 2011-04-26 NOTE — Assessment & Plan Note (Signed)
Not Adequate control on present rx, reviewed options, add back amlodipine since can't use diurectic or increase chronotropics with base pulse 60 - may need to titrate amlodipine down to qod on one half daily based on previous experience

## 2011-04-26 NOTE — Assessment & Plan Note (Signed)
Reviewed treatment options, needs to discuss with dermatology, purely cosmetic and optional at this point from medical perspective

## 2011-05-03 ENCOUNTER — Encounter: Payer: Self-pay | Admitting: Internal Medicine

## 2011-05-27 ENCOUNTER — Other Ambulatory Visit: Payer: Self-pay | Admitting: Internal Medicine

## 2011-05-28 ENCOUNTER — Other Ambulatory Visit: Payer: Self-pay | Admitting: *Deleted

## 2011-05-28 MED ORDER — LEVOTHYROXINE SODIUM 75 MCG PO TABS: 75.0000 ug | ORAL_TABLET | Freq: Every day | ORAL | Status: DC

## 2011-06-26 ENCOUNTER — Ambulatory Visit (INDEPENDENT_AMBULATORY_CARE_PROVIDER_SITE_OTHER): Payer: Medicare Other | Admitting: Internal Medicine

## 2011-06-26 ENCOUNTER — Encounter: Payer: Self-pay | Admitting: Internal Medicine

## 2011-06-26 VITALS — BP 130/84 | HR 69 | Temp 97.8°F | Ht 72.0 in | Wt 209.2 lb

## 2011-06-26 DIAGNOSIS — M2550 Pain in unspecified joint: Secondary | ICD-10-CM

## 2011-06-26 DIAGNOSIS — I10 Essential (primary) hypertension: Secondary | ICD-10-CM

## 2011-06-26 DIAGNOSIS — N259 Disorder resulting from impaired renal tubular function, unspecified: Secondary | ICD-10-CM

## 2011-06-26 NOTE — Assessment & Plan Note (Signed)
prob nothing more than mild djd, try advil first since renal function ok, low threshold to refer to ortho if not effective

## 2011-06-26 NOTE — Patient Instructions (Addendum)
Take advil up to 4 with meals as needed and if not better within a week or two you to be seen by [name] or [name].   Dr Pollyann Kennedy and Annalee Genta are ENT doctors we can refer you to if desired for hearing loss

## 2011-06-26 NOTE — Assessment & Plan Note (Signed)
Lab Results  Component Value Date   CREATININE 1.3 04/25/2011   CREATININE 1.1 06/05/2010   CREATININE 1.09 05/29/2010

## 2011-06-26 NOTE — Progress Notes (Signed)
  Subjective:    Patient ID: [name], male    DOB: 12-Dec-1929, 77 y.o.   MRN: [id number]  HPI  13 yowm with history of hypertenision, hyperlipidemia, and hypothyroidism.   July 19, 2008 cpx recovering from sarcoma surgery at [facility] and has been referred to Oncology and planning to start chemo with extensive labs and xrays there in 12/09.   December 30, 2008 ov for eval of orthostatic lightheadedness resolved off amlodipine for recheck bp doing well now. rec stop amlodipine and no more spells ok self monitoring   Admit [facility] 11/11-14/2011 syncope, hyponatremia, nl echo and MRA of brain > d/c hctz      04/25/2011 f/u ov/Zyad Boomer CPX some urinary freq no hesitation or urgency or nocturia. rec Add amlodipine 5mg  one each am - hold this if light headed standing  Please schedule a follow up visit in 2 months but call sooner if needed   I would defer to Medical Oncologist re what studies for surveillance for recurrence of cancer   06/26/2011 f/u ov/Kaylon Laroche for f/u hbp/mild cri cc L knee pain x one month attributed to more climbing steps, no swelling or stiffness.  Has not tried advil yet. Also c/o mild chronic hearing loss s tinnitis or ear pain.  Sleeping ok-   No  obvious fluctuation of symptoms with weather or environmental changes or other aggravating or alleviating factors except as outlined above   ROS  At present neg for  any significant sore throat, dysphagia, itching, sneezing,  nasal congestion or excess/ purulent secretions,  fever, chills, sweats, unintended wt loss, pleuritic or exertional cp, hempoptysis, orthopnea pnd or leg swelling.  Also denies presyncope, palpitations, heartburn, abdominal pain, nausea, vomiting, diarrhea  or change in bowel or urinary habits, dysuria,hematuria,  rash, arthralgias, visual complaints, headache, numbness weakness or ataxia.       Past Medical History:  MICROSCOPIC HEMATURIA (ICD-599.72)...................................................Marland KitchenWrenn   HYPERTENSIVE CARDIOVASCULAR DISEASE, BENIGN (ICD-402.10)  ONYCHOMYCOSIS (ICD-110.1)  DIVERTICULOSIS.........................................................................................Marland KitchenRussella Dar  - See colonscopy 03/2000  HYPERLIPIDEMIA (ICD-272.4)  - Target LDL < 70 (hbp, male, pos fm hx/ pos mrangiogram 2/05 ascvd  SLEEP APNEA, OBSTRUCTIVE (ICD-327.23)  OBESITY (ICD-278.00)  - Target wt = 208 for BMI < 30  HYPOTHYROIDISM (ICD-244.9)  SEIZURE DISORDER (ICD-780.39)  CARDIOVASCULAR DISEASE (ICD-429.2)  HYPERTENSION (ICD-401.9)  Sarcoma left leg........................................................................................................Marland KitchenWard, [facility])  - 07/14/08 sarcoma removal left femur > last rx July 2010 > CR by scans 07/2009 due for repeat 05/2011 - Chemo complete 12/2008  Memory Loss indolent onset 20111  - CT Head February 20, 2010 >>> Atrophy and chronic microvascular ischemia. No acute abnormality. - MMSE 03/02/10 30/30 HEALTH MAINTENANCE.............................................................................................Marland KitchenWert  - Pneumovax 04/1999  - DT 04/2006  - CPX  04/25/2011          Objective:   Physical Exam   wt 214 July 19, 2008 >  196 July 25, 2009 > 04/25/2011  202> 06/26/2011 209 Ambulatory healthy appearing in no acute distress. BP elevated as noted HEENT: nl dentition, turbinates, and orophanx. Nl external ear canals without cough reflex/ grossly nl hearing  Neck without JVD/Nodes/TM  Lungs clear to A and P bilaterally without cough on insp or exp maneuvers  RRR no s3 or murmur or increase in P2  Abd soft and benign with nl excursion in the supine position. No bruits or organomegaly  Ext warm without calf tenderness, cyanosis clubbing.  MS minimal crepitance L knee, no effusion or instability/ nl gait./ nl ROM  Assessment & Plan:

## 2011-06-26 NOTE — Assessment & Plan Note (Signed)
Adequate control on present rx, reviewed  

## 2011-07-03 ENCOUNTER — Other Ambulatory Visit: Payer: Self-pay | Admitting: Internal Medicine

## 2011-07-09 ENCOUNTER — Other Ambulatory Visit: Payer: Self-pay | Admitting: *Deleted

## 2011-07-09 MED ORDER — SIMVASTATIN 40 MG PO TABS: 40.0000 mg | ORAL_TABLET | Freq: Every day | ORAL | Status: DC

## 2011-07-24 ENCOUNTER — Other Ambulatory Visit: Payer: Self-pay | Admitting: Internal Medicine

## 2011-07-24 MED ORDER — ATENOLOL 25 MG PO TABS: 25.0000 mg | ORAL_TABLET | Freq: Every day | ORAL | Status: DC

## 2011-11-28 ENCOUNTER — Other Ambulatory Visit: Payer: Self-pay | Admitting: Internal Medicine

## 2011-11-28 MED ORDER — LOSARTAN POTASSIUM 50 MG PO TABS: 50.0000 mg | ORAL_TABLET | Freq: Every day | ORAL | Status: DC

## 2011-12-22 ENCOUNTER — Other Ambulatory Visit: Payer: Self-pay | Admitting: Internal Medicine

## 2012-01-03 ENCOUNTER — Other Ambulatory Visit (INDEPENDENT_AMBULATORY_CARE_PROVIDER_SITE_OTHER): Payer: Medicare Other

## 2012-01-03 ENCOUNTER — Encounter: Payer: Self-pay | Admitting: Internal Medicine

## 2012-01-03 ENCOUNTER — Ambulatory Visit (INDEPENDENT_AMBULATORY_CARE_PROVIDER_SITE_OTHER): Payer: Medicare Other | Admitting: Internal Medicine

## 2012-01-03 VITALS — BP 152/90 | HR 67 | Temp 97.8°F | Ht 71.0 in | Wt 206.0 lb

## 2012-01-03 DIAGNOSIS — I10 Essential (primary) hypertension: Secondary | ICD-10-CM

## 2012-01-03 DIAGNOSIS — E785 Hyperlipidemia, unspecified: Secondary | ICD-10-CM

## 2012-01-03 DIAGNOSIS — N259 Disorder resulting from impaired renal tubular function, unspecified: Secondary | ICD-10-CM

## 2012-01-03 LAB — HEPATIC FUNCTION PANEL
Albumin: 4.2 g/dL (ref 3.5–5.2)
Alkaline Phosphatase: 56 U/L (ref 39–117)
Total Protein: 6.9 g/dL (ref 6.0–8.3)

## 2012-01-03 LAB — BASIC METABOLIC PANEL
CO2: 29 mEq/L (ref 19–32)
Calcium: 9.7 mg/dL (ref 8.4–10.5)
Glucose, Bld: 97 mg/dL (ref 70–99)
Sodium: 134 mEq/L — ABNORMAL LOW (ref 135–145)

## 2012-01-03 LAB — LIPID PANEL: HDL: 60.8 mg/dL (ref >39.00)

## 2012-01-03 MED ORDER — AMLODIPINE BESYLATE 10 MG PO TABS: 10.0000 mg | ORAL_TABLET | Freq: Every day | ORAL | Status: DC

## 2012-01-03 NOTE — Assessment & Plan Note (Signed)
Lab Results  Component Value Date   CREATININE 1.1 01/03/2012   CREATININE 1.3 04/25/2011   CREATININE 1.1 06/05/2010     Improved with avoidance of nsaids but still on arb daily.

## 2012-01-03 NOTE — Assessment & Plan Note (Signed)
Lab Results  Component Value Date   LDLCALC 62 01/03/2012    Adequate control on present rx, reviewed lft's also ok on 40 mg per day statin tol well

## 2012-01-03 NOTE — Assessment & Plan Note (Signed)
Not ideal but renal fxn better off nsaids, try double dose of norvasc to get bp down s use of diuretics or arb increase and f/u in 3 months

## 2012-01-03 NOTE — Progress Notes (Signed)
Subjective:    Patient ID: [name], male    DOB: August 08, 1929, 78 y.o.   MRN: [id number]  HPI  53 yowm with history of hypertenision, hyperlipidemia, and hypothyroidism.   July 19, 2008 cpx recovering from sarcoma surgery at [name] and has been referred to Oncology and planning to start chemo with extensive labs and xrays there in 12/09.   December 30, 2008 ov for eval of orthostatic lightheadedness resolved off amlodipine for recheck bp doing well now. rec stop amlodipine and no more spells ok self monitoring   Admit [name] 11/11-14/2011 syncope, hyponatremia, nl echo and MRA of brain > d/c hctz      04/25/2011 f/u ov/[name] CPX some urinary freq no hesitation or urgency or nocturia. rec Add amlodipine 5mg  one each am - hold this if light headed standing  Please schedule a follow up visit in 2 months but call sooner if needed   I would defer to Medical Oncologist re what studies for surveillance for recurrence of cancer   06/26/2011 f/u ov/[name] for f/u hbp/mild cri cc L knee pain x one month attributed to more climbing steps, no swelling or stiffness.  Has not tried advil yet. Also c/o mild chronic hearing loss s tinnitis or ear pain. rec Take advil up to 4 with meals as needed and if not better within a week or two you to be seen by [name] or [name].   Dr [name] and [name] are ENT doctors we can refer you to if desired for hearing loss   01/03/2012 f/u ov/[name] cc f/u hbp / cri / hyperlipidemia/ no need for nsaids, no cp or sob or tia.   Sleeping ok-   No  obvious fluctuation of symptoms with weather or environmental changes or other aggravating or alleviating factors except as outlined above   ROS  At present neg for  any significant sore throat, dysphagia, itching, sneezing,  nasal congestion or excess/ purulent secretions,  fever, chills, sweats, unintended wt loss, pleuritic or exertional cp, hempoptysis, orthopnea pnd or leg swelling.  Also denies presyncope,  palpitations, heartburn, abdominal pain, nausea, vomiting, diarrhea  or change in bowel or urinary habits, dysuria,hematuria,  rash, arthralgias, visual complaints, headache, numbness weakness or ataxia.       Past Medical History:  MICROSCOPIC HEMATURIA (ICD-599.72)...................................................Marland KitchenWrenn  HYPERTENSIVE CARDIOVASCULAR DISEASE, BENIGN (ICD-402.10)  ONYCHOMYCOSIS (ICD-110.1)  DIVERTICULOSIS.........................................................................................Marland KitchenRussella [name]  - See colonscopy 03/2000  HYPERLIPIDEMIA (ICD-272.4)  - Target LDL < 70 (hbp, male, pos fm hx/ pos mrangiogram 2/05 ascvd  SLEEP APNEA, OBSTRUCTIVE (ICD-327.23)  OBESITY (ICD-278.00)  - Target wt = 208 for BMI < 30  HYPOTHYROIDISM (ICD-244.9)  SEIZURE DISORDER (ICD-780.39)  CARDIOVASCULAR DISEASE (ICD-429.2)  HYPERTENSION (ICD-401.9)  Sarcoma left leg........................................................................................................Marland KitchenWard, [name])  - 07/14/08 sarcoma removal left femur > last rx July 2010 > CR by scans 07/2009 due for repeat 05/2011 - Chemo complete 12/2008  Memory Loss indolent onset 20111  - CT Head February 20, 2010 >>> Atrophy and chronic microvascular ischemia. No acute abnormality. - MMSE 03/02/10 30/30 HEALTH MAINTENANCE.............................................................................................Marland KitchenWert  - Pneumovax 04/1999  - DT 04/2006  - CPX  04/25/2011          Objective:   Physical Exam   wt 214 July 19, 2008 >  196 July 25, 2009 >  06/26/2011 209>  01/03/2012  206 Ambulatory healthy appearing in no acute distress. BP elevated as noted HEENT: nl dentition, turbinates, and orophanx. Nl external ear canals without cough reflex/ grossly nl hearing  Neck without JVD/Nodes/TM  Lungs clear to A and P bilaterally without cough on insp or exp maneuvers  RRR no s3 or murmur or increase in P2  Abd soft and  benign with nl excursion in the supine position. No bruits or organomegaly  Ext warm without calf tenderness, cyanosis clubbing.  MS minimal crepitance L knee, no effusion or instability/ nl gait./ nl ROM  Assessment & Plan:

## 2012-01-03 NOTE — Patient Instructions (Addendum)
Increase norvasc 10mg  one daily  Please remember to go to the lab  department downstairs for your tests - we will call you with the results when they are available.     Yearly evaluation due after 04/24/12

## 2012-01-04 ENCOUNTER — Telehealth: Payer: Self-pay | Admitting: Internal Medicine

## 2012-01-04 NOTE — Telephone Encounter (Signed)
Notes Recorded by Nyoka Cowden, MD on 01/03/2012 at 2:18 PM Call patient : Studies are unremarkable, no change in recs - cholesterol doing great, kidney function improved  I spoke with patient about results and he verbalized understanding and had no questions

## 2012-01-16 ENCOUNTER — Other Ambulatory Visit: Payer: Self-pay | Admitting: Internal Medicine

## 2012-01-19 ENCOUNTER — Other Ambulatory Visit: Payer: Self-pay | Admitting: Internal Medicine

## 2012-03-19 ENCOUNTER — Encounter: Payer: Self-pay | Admitting: Gastroenterology

## 2012-04-21 ENCOUNTER — Other Ambulatory Visit: Payer: Self-pay | Admitting: Internal Medicine

## 2012-05-20 ENCOUNTER — Other Ambulatory Visit: Payer: Self-pay | Admitting: Internal Medicine

## 2012-06-11 ENCOUNTER — Ambulatory Visit (INDEPENDENT_AMBULATORY_CARE_PROVIDER_SITE_OTHER): Payer: Medicare Other | Admitting: Internal Medicine

## 2012-06-11 ENCOUNTER — Other Ambulatory Visit (INDEPENDENT_AMBULATORY_CARE_PROVIDER_SITE_OTHER): Payer: Medicare Other

## 2012-06-11 ENCOUNTER — Encounter: Payer: Self-pay | Admitting: Internal Medicine

## 2012-06-11 VITALS — BP 136/90 | HR 71 | Temp 98.7°F | Ht 71.0 in | Wt 203.0 lb

## 2012-06-11 DIAGNOSIS — E785 Hyperlipidemia, unspecified: Secondary | ICD-10-CM

## 2012-06-11 DIAGNOSIS — Z23 Encounter for immunization: Secondary | ICD-10-CM

## 2012-06-11 DIAGNOSIS — N259 Disorder resulting from impaired renal tubular function, unspecified: Secondary | ICD-10-CM

## 2012-06-11 DIAGNOSIS — C801 Malignant (primary) neoplasm, unspecified: Secondary | ICD-10-CM

## 2012-06-11 DIAGNOSIS — I10 Essential (primary) hypertension: Secondary | ICD-10-CM

## 2012-06-11 DIAGNOSIS — E039 Hypothyroidism, unspecified: Secondary | ICD-10-CM

## 2012-06-11 LAB — LIPID PANEL
Cholesterol: 135 mg/dL (ref 0–200)
Triglycerides: 104 mg/dL (ref 0.0–149.0)
VLDL: 20.8 mg/dL (ref 0.0–40.0)

## 2012-06-11 LAB — BASIC METABOLIC PANEL
CO2: 28 mEq/L (ref 19–32)
Chloride: 98 mEq/L (ref 96–112)
Creatinine, Ser: 1.1 mg/dL (ref 0.4–1.5)
Potassium: 4.4 mEq/L (ref 3.5–5.1)
Sodium: 132 mEq/L — ABNORMAL LOW (ref 135–145)

## 2012-06-11 LAB — HEPATIC FUNCTION PANEL
ALT: 21 U/L (ref 0–53)
Albumin: 4 g/dL (ref 3.5–5.2)
Bilirubin, Direct: 0.1 mg/dL (ref 0.0–0.3)
Total Protein: 7 g/dL (ref 6.0–8.3)

## 2012-06-11 LAB — URINALYSIS, ROUTINE W REFLEX MICROSCOPIC
Ketones, ur: NEGATIVE
Leukocytes, UA: NEGATIVE
Urine Glucose: NEGATIVE
Urobilinogen, UA: 0.2 (ref 0.0–1.0)

## 2012-06-11 LAB — TSH: TSH: 0.48 u[IU]/mL (ref 0.35–5.50)

## 2012-06-11 NOTE — Progress Notes (Signed)
Subjective:    Patient ID: [name], male    DOB: [birth year]    MRN: [id number]    Brief patient profile:  67 yowm with history of hypertenision, hyperlipidemia, and hypothyroidism.   July 19, 2008 cpx recovering from sarcoma surgery at [facility] and has been referred to Oncology and planning to start chemo with extensive labs and xrays there in 12/09.   December 30, 2008 ov for eval of orthostatic lightheadedness resolved off amlodipine for recheck bp doing well now. rec stop amlodipine and no more spells ok self monitoring   Admit [facility] 11/11-14/2011 syncope, hyponatremia, nl echo and MRA of brain > d/c'd hctz      04/25/2011 f/u ov/[name] CPX some urinary freq no hesitation or urgency or nocturia. rec Add amlodipine 5mg  one each am - hold this if light headed standing I would defer to Medical Oncologist re what studies for surveillance for recurrence of cancer   06/26/2011 f/u ov/[name] for f/u hbp/mild cri cc L knee pain x one month attributed to more climbing steps, no swelling or stiffness.  Has not tried advil yet. Also c/o mild chronic hearing loss s tinnitis or ear pain. rec Take advil up to 4 with meals as needed and if not better within a week or two you to be seen by [name] or [name].   Dr [name] and [name] are ENT doctors we can refer you to if desired for hearing loss   01/03/2012 f/u ov/[name] cc f/u hbp / cri / hyperlipidemia  rec Increase norvasc 10mg  one daily    06/11/2012 f/u ov/[name]  Yearly eval for hbp, hypothyroid, hyperlipidemia cc light headed on 10 mg per day amlodipine so back on one half walking up to a mile a day.  No cp/ tia, claudication, sob, cough.  Regular f/u at [facility] oncology for sarcoma with cxr due soon there and no evidence of recurrence to date   Sleeping ok without nocturnal  or early am exacerbation  of respiratory  c/o's or need for noct saba. Also denies any obvious fluctuation of symptoms with weather or environmental changes or  other aggravating or alleviating factors except as outlined above   ROS  The following are not active complaints unless bolded sore throat, dysphagia, dental problems, itching, sneezing,  nasal congestion or excess/ purulent secretions, ear ache,   fever, chills, sweats, unintended wt loss, pleuritic or exertional cp, hemoptysis,  orthopnea pnd or leg swelling, presyncope, palpitations, heartburn, abdominal pain, anorexia, nausea, vomiting, diarrhea  or change in bowel or urinary habits, change in stools or urine, dysuria,hematuria,  rash, arthralgias, visual complaints, headache, numbness weakness or ataxia or problems with walking or coordination,  change in mood/affect or memory.           Past Medical History:  MICROSCOPIC HEMATURIA (ICD-599.72)...................................................Marland KitchenWrenn  HYPERTENSIVE CARDIOVASCULAR DISEASE, BENIGN (ICD-402.10)  ONYCHOMYCOSIS (ICD-110.1)  DIVERTICULOSIS.........................................................................................Marland KitchenRussella [name]  - See colonscopy 03/2000  HYPERLIPIDEMIA (ICD-272.4)  - Target LDL < 70 (hbp, male, pos fm hx/ pos mrangiogram 08/2003 ascvd  SLEEP APNEA, OBSTRUCTIVE (ICD-327.23)  OBESITY (ICD-278.00)  - Target wt = 208 for BMI < 30  HYPOTHYROIDISM (ICD-244.9)  SEIZURE DISORDER (ICD-780.39)  CARDIOVASCULAR DISEASE (ICD-429.2)  HYPERTENSION (ICD-401.9)  Sarcoma left leg........................................................................................................Marland KitchenWard, [name])  - 07/14/08 sarcoma removal left femur > last rx July 2010 > CR by scans 07/2009 due for repeat 05/2011 - Chemo complete 12/2008  Memory Loss indolent onset 2011 - CT Head February 20, 2010 >>> Atrophy and chronic microvascular ischemia. No acute abnormality. -  MMSE 03/02/10 30/30 HEALTH MAINTENANCE.............................................................................................Marland KitchenWert  - Pneumovax 04/1999  - DT  04/2006  - CPX  06/11/2012          Objective:   Physical Exam   wt 214 July 19, 2008 >  06/26/2011 209>  01/03/2012  206>06/11/2012  203 Ambulatory healthy appearing in no acute distress.   HEENT: nl dentition, turbinates, and orophanx. Nl external ear canals without cough reflex/ grossly nl hearing  Neck without JVD/Nodes/TM  Lungs clear to A and P bilaterally without cough on insp or exp maneuvers  RRR no s3 or murmur or increase in P2  Abd soft and benign with nl excursion in the supine position. No bruits or organomegaly  Ext warm without calf tenderness, cyanosis clubbing.  MS minimal crepitance L knee, no effusion or instability/ nl gait./ nl ROM R PT and L DP intact GU testes down bilaterally, no IH Rectal:  Mild bph, no nodules Stool G neg Neuro:  Very good short term recall, no motor deficits, no cerebellar findings, nl reflexes      Assessment & Plan:

## 2012-06-11 NOTE — Progress Notes (Signed)
Quick Note:  Called, spoke with pt. Informed him of lab results and recs per Dr. [name]. He verbalized understanding and voiced no further questions/concerns at this time. ______ 

## 2012-06-11 NOTE — Patient Instructions (Addendum)
Please remember to go to the lab  department downstairs for your tests - we will call you with the results when they are available.  Your lab results should be available on line so they don't need to be repeated at [facility] - call if you want them faxed.  Please schedule a follow up office visit in 6 weeks, call sooner if needed

## 2012-06-11 NOTE — Progress Notes (Signed)
Quick Note:  Called, spoke with pt. Informed him of lab results and recs per Dr. Sherene Sires. He verbalized understanding and voiced no further questions/concerns at this time. ______

## 2012-06-12 NOTE — Assessment & Plan Note (Signed)
Lab Results  Component Value Date   LDLCALC 59 06/11/2012    Adequate control on present rx, reviewed.

## 2012-06-12 NOTE — Assessment & Plan Note (Signed)
Due annual f/u soon with no evidence recurrence to date.

## 2012-06-12 NOTE — Assessment & Plan Note (Signed)
Lab Results  Component Value Date   TSH 0.48 06/11/2012     Adequate control on present rx, reviewed   

## 2012-06-12 NOTE — Assessment & Plan Note (Signed)
Lab Results  Component Value Date   CREATININE 1.1 06/11/2012   CREATININE 1.1 01/03/2012   CREATININE 1.3 04/25/2011    Adequate control on present rx, reviewed

## 2012-06-12 NOTE — Assessment & Plan Note (Signed)
Adequate control on present rx, reviewed  

## 2012-06-18 ENCOUNTER — Other Ambulatory Visit: Payer: Self-pay | Admitting: Internal Medicine

## 2012-07-30 ENCOUNTER — Encounter: Payer: Self-pay | Admitting: Internal Medicine

## 2012-07-30 ENCOUNTER — Ambulatory Visit (INDEPENDENT_AMBULATORY_CARE_PROVIDER_SITE_OTHER): Payer: Medicare Other | Admitting: Internal Medicine

## 2012-07-30 ENCOUNTER — Ambulatory Visit: Payer: Medicare Other | Admitting: Internal Medicine

## 2012-07-30 VITALS — BP 124/80 | HR 68 | Temp 97.5°F | Ht 71.0 in | Wt 207.0 lb

## 2012-07-30 DIAGNOSIS — I10 Essential (primary) hypertension: Secondary | ICD-10-CM

## 2012-07-30 DIAGNOSIS — E785 Hyperlipidemia, unspecified: Secondary | ICD-10-CM

## 2012-07-30 DIAGNOSIS — E039 Hypothyroidism, unspecified: Secondary | ICD-10-CM

## 2012-07-30 NOTE — Assessment & Plan Note (Signed)
Adequate control on present rx, reviewed  

## 2012-07-30 NOTE — Assessment & Plan Note (Addendum)
Lab Results  Component Value Date   TSH 0.48 06/11/2012     Adequate control on present rx, reviewed

## 2012-07-30 NOTE — Patient Instructions (Addendum)
No change in medications  You are do yearly eval in 06/15/13

## 2012-07-30 NOTE — Progress Notes (Signed)
Subjective:    Patient ID: [name], male    DOB: 03-28-1930    MRN: [id number]    Brief patient profile:  82 yowm with history of hypertenision, hyperlipidemia, and hypothyroidism.   July 19, 2008 cpx recovering from sarcoma surgery at [facility] and has been referred to Oncology and planning to start chemo with extensive labs and xrays there in 12/09.   December 30, 2008 ov for eval of orthostatic lightheadedness resolved off amlodipine for recheck bp doing well now. rec stop amlodipine and no more spells ok self monitoring   Admit [facility] 11/11-14/2011 syncope, hyponatremia, nl echo and MRA of brain > d/c'd hctz      04/25/2011 f/u ov/Casy Brunetto CPX some urinary freq no hesitation or urgency or nocturia. rec Add amlodipine 5mg  one each am - hold this if light headed standing I would defer to Medical Oncologist re what studies for surveillance for recurrence of cancer   06/26/2011 f/u ov/Raeford Brandenburg for f/u hbp/mild cri cc L knee pain x one month attributed to more climbing steps, no swelling or stiffness.  Has not tried advil yet. Also c/o mild chronic hearing loss s tinnitis or ear pain. rec Take advil up to 4 with meals as needed and if not better within a week or two you to be seen by [name] or [name].   Dr Pollyann Kennedy and Annalee Genta are ENT doctors we can refer you to if desired for hearing loss   01/03/2012 f/u ov/Emileigh Kellett cc f/u hbp / cri / hyperlipidemia  rec Increase norvasc 10mg  one daily    06/11/2012 f/u ov/Una Yeomans  Yearly eval for hbp, hypothyroid, hyperlipidemia cc light headed on 10 mg per day amlodipine so back on one half walking up to a mile a day.  rec No change rx   07/30/2012 f/u ov/Danyon Mcginness cc f/u hbp/ hypothyroid/ hyperlipid   No cp/ tia, claudication, sob, cough.   Sleeping ok without nocturnal  or early am exacerbation  of respiratory  c/o's or need for noct saba. Also denies any obvious fluctuation of symptoms with weather or environmental changes or other aggravating or  alleviating factors except as outlined above   ROS  The following are not active complaints unless bolded sore throat, dysphagia, dental problems, itching, sneezing,  nasal congestion or excess/ purulent secretions, ear ache,   fever, chills, sweats, unintended wt loss, pleuritic or exertional cp, hemoptysis,  orthopnea pnd or leg swelling, presyncope, palpitations, heartburn, abdominal pain, anorexia, nausea, vomiting, diarrhea  or change in bowel or urinary habits, change in stools or urine, dysuria,hematuria,  rash, arthralgias, visual complaints, headache, numbness weakness or ataxia or problems with walking or coordination,  change in mood/affect or memory.           Past Medical History:  MICROSCOPIC HEMATURIA (ICD-599.72)...................................................Marland KitchenWrenn  HYPERTENSIVE CARDIOVASCULAR DISEASE, BENIGN (ICD-402.10)  ONYCHOMYCOSIS (ICD-110.1)  DIVERTICULOSIS.........................................................................................Marland KitchenRussella Dar  - See colonscopy 03/2000  HYPERLIPIDEMIA (ICD-272.4)  - Target LDL < 70 (hbp, male, pos fm hx/ pos mrangiogram 08/2003 ascvd  SLEEP APNEA, OBSTRUCTIVE (ICD-327.23)  OBESITY (ICD-278.00)  - Target wt = 208 for BMI < 30  HYPOTHYROIDISM (ICD-244.9)  SEIZURE DISORDER (ICD-780.39)  CARDIOVASCULAR DISEASE (ICD-429.2)  HYPERTENSION (ICD-401.9)  Sarcoma left leg........................................................................................................Marland KitchenWard, [facility])  - 07/14/08 sarcoma removal left femur > last rx July 2010 > CR by scans 07/2009 due for repeat 05/2011 - Chemo complete 12/2008  Memory Loss indolent onset 2011 - CT Head February 20, 2010 >>> Atrophy and chronic microvascular ischemia. No acute abnormality. - MMSE 03/02/10 30/30 HEALTH  MAINTENANCE.............................................................................................Marland KitchenWert  - Pneumovax 04/1999  - DT 04/2006  - CPX  06/11/2012           Objective:   Physical Exam   wt 214 July 19, 2008 >  06/26/2011 209>  01/03/2012  206>06/11/2012  203> 207 07/30/2012  Ambulatory healthy appearing in no acute distress.   HEENT: nl dentition, turbinates, and orophanx. Nl external ear canals without cough reflex/ grossly nl hearing  Neck without JVD/Nodes/TM  Lungs clear to A and P bilaterally without cough on insp or exp maneuvers  RRR no s3 or murmur or increase in P2  Abd soft and benign with nl excursion in the supine position. No bruits or organomegaly  Ext warm without calf tenderness, cyanosis clubbing.  MS minimal crepitance L knee, no effusion or instability/ nl gait./ nl ROM        Assessment & Plan:

## 2012-07-30 NOTE — Assessment & Plan Note (Signed)
-   Target LDL < 70 (hbp, male, pos fm hx/ pos mrangiogram 08/2003 ascvd   Lab Results  Component Value Date   CHOL 135 06/11/2012   HDL 54.90 06/11/2012   LDLCALC 59 06/11/2012   TRIG 104.0 06/11/2012   CHOLHDL 2 06/11/2012     Adequate control on present rx, reviewed

## 2012-08-06 IMAGING — CT CT HEAD W/O CM
1 of 2 series · 13 of 30 positions shown, 17 images · non-contrast
Comparison: 02/15/2010

CLINICAL DATA: Hypertension.  Hypothyroidism.  Sarcoma.  Near
syncope.  Weakness.

CT HEAD WITHOUT CONTRAST
TECHNIQUE: Contiguous axial images were obtained from the base of
the skull through the vertex without contrast.

[Series 2: brain · axial · 0.49mm/px · z∈[+109,+250]mm · 13 of 32 slices shown, 17 images]
[im 3/32  brain]
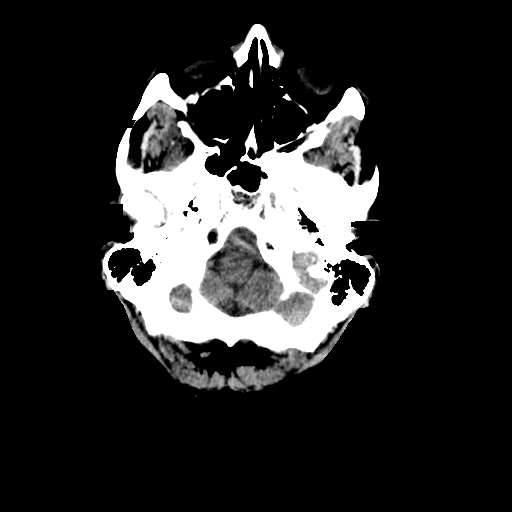
[im 3/32  bone]
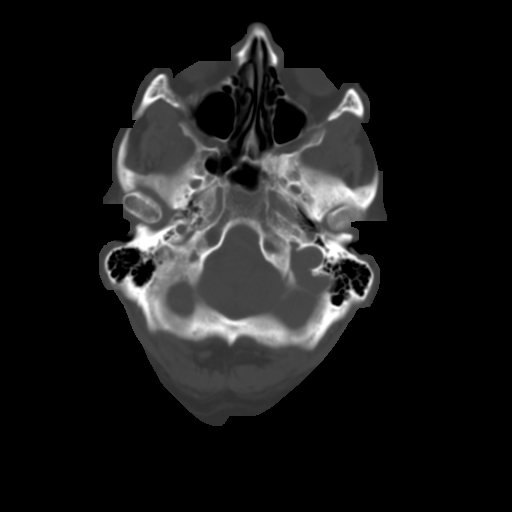
[im 5/32  brain]
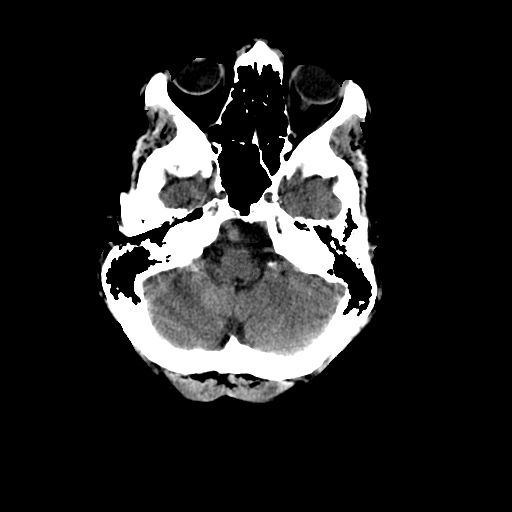
[im 7/32  brain]
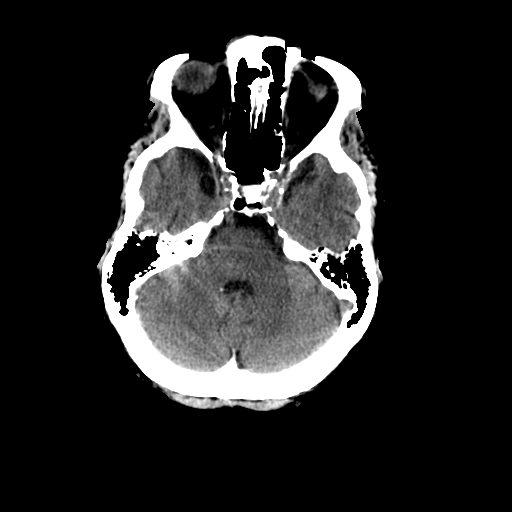
[im 9/32  brain]
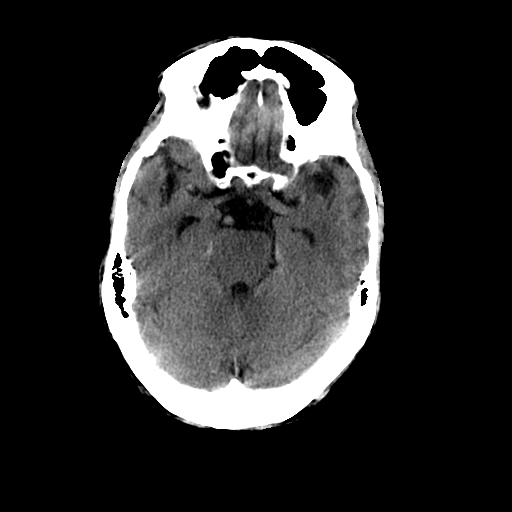
[im 12/32  brain]
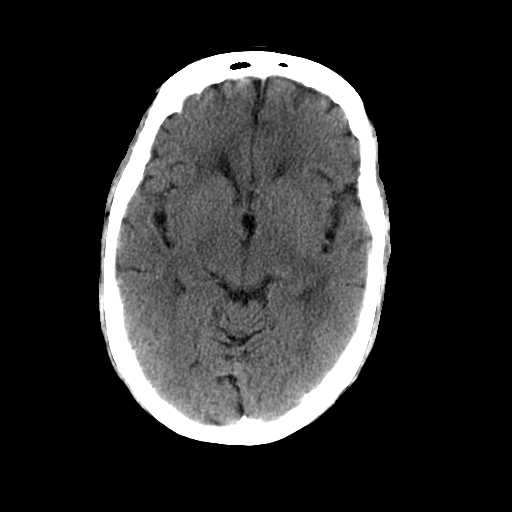
[im 12/32  bone]
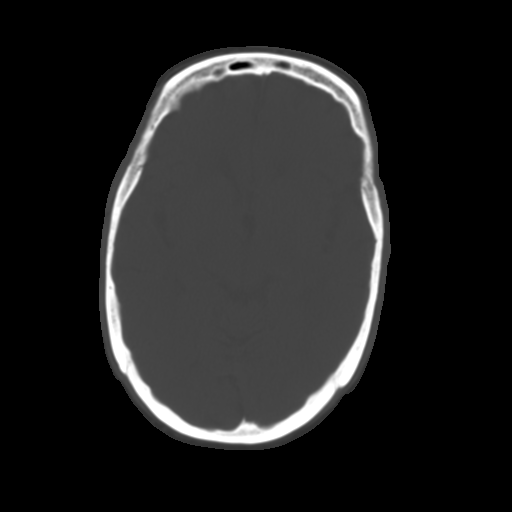
[im 14/32  brain]
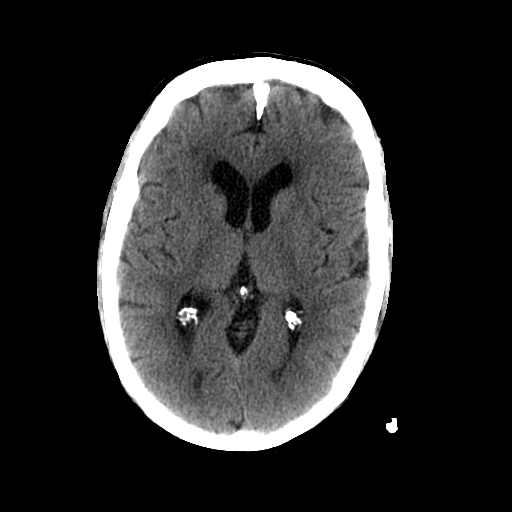
[im 16/32  brain]
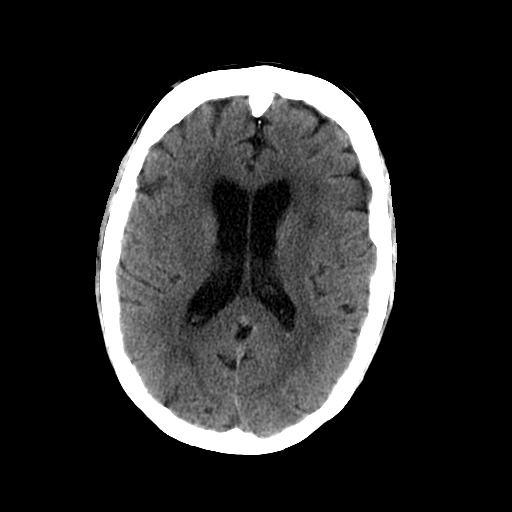
[im 18/32  brain]
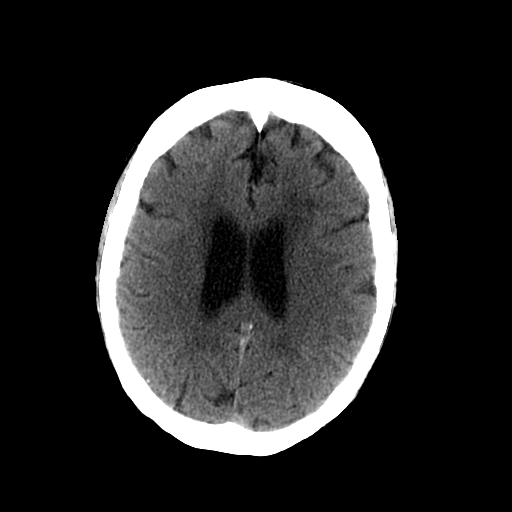
[im 20/32  brain]
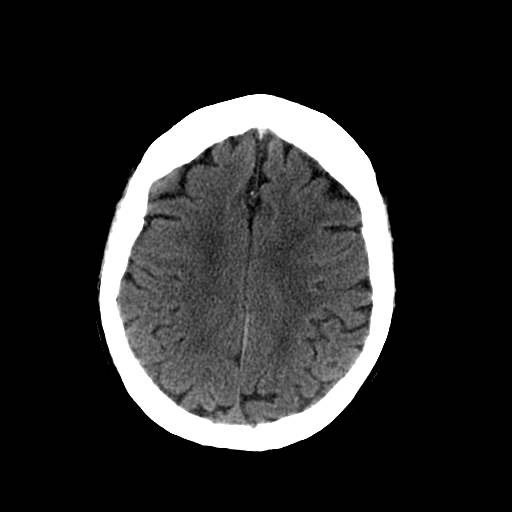
[im 20/32  bone]
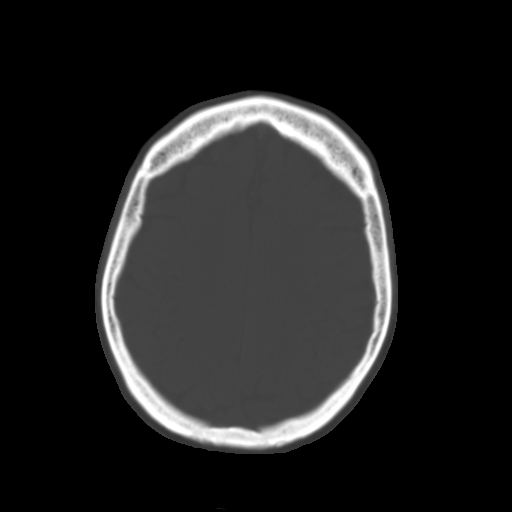
[im 23/32  brain]
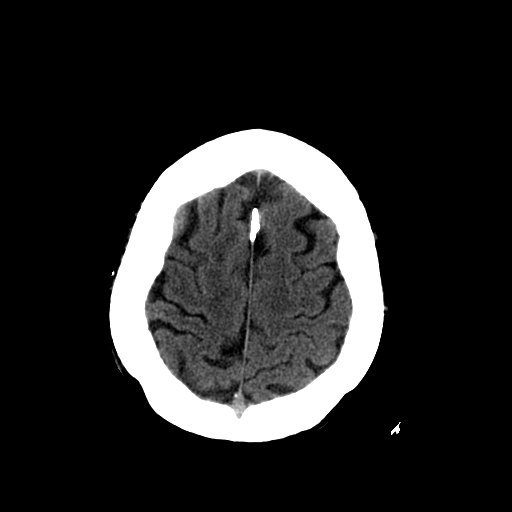
[im 25/32  brain]
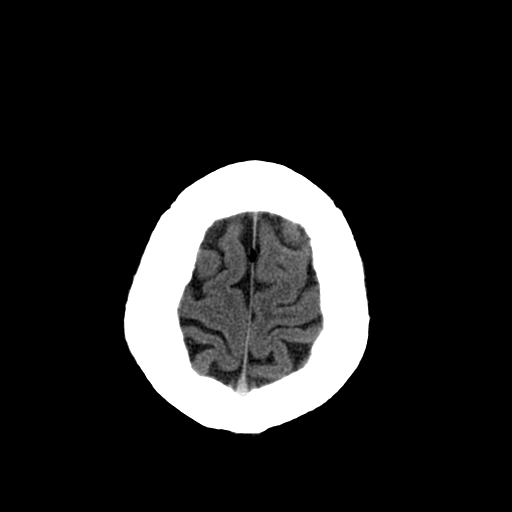
[im 27/32  brain]
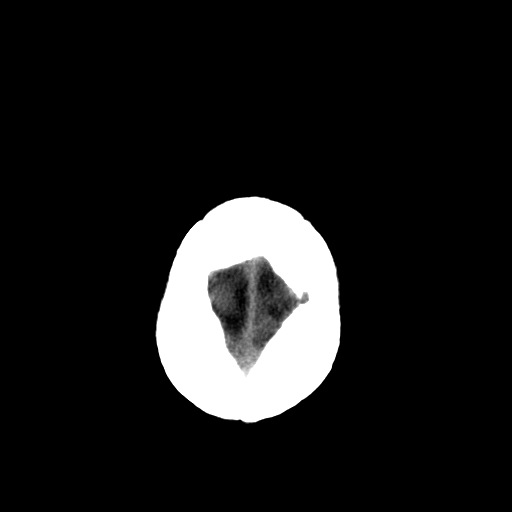
[im 29/32  brain]
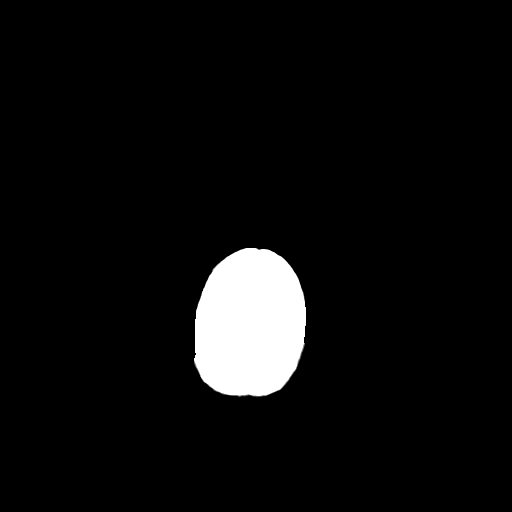
[im 29/32  bone]
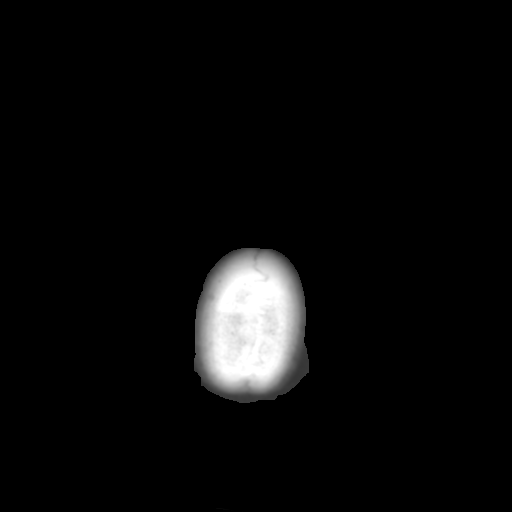

[13 of 30 positions shown; findings below may reference images not displayed]

FINDINGS: Tiny right caudate head remote lacunar infarct noted.
Otherwise, the brain stem, cerebellum, cerebral peduncles, thalami,
basal ganglia, basilar cisterns, and ventricular system appear
unremarkable.

Periventricular and corona radiata white matter hypodensities are
most compatible with chronic ischemic microvascular white matter
disease.

No intracranial hemorrhage, mass lesion, or acute infarction is
identified.
IMPRESSION: 1. Periventricular and corona radiata white matter hypodensities
are most compatible with chronic ischemic microvascular white
matter disease.
2.  Tiny remote right caudate head lacunar infarct.
3.  No acute intracranial findings.

## 2012-08-17 ENCOUNTER — Other Ambulatory Visit: Payer: Self-pay | Admitting: Internal Medicine

## 2013-02-09 ENCOUNTER — Other Ambulatory Visit: Payer: Self-pay | Admitting: Internal Medicine

## 2013-02-15 ENCOUNTER — Other Ambulatory Visit: Payer: Self-pay | Admitting: Internal Medicine

## 2013-04-29 ENCOUNTER — Ambulatory Visit (INDEPENDENT_AMBULATORY_CARE_PROVIDER_SITE_OTHER): Payer: Medicare Other

## 2013-04-29 DIAGNOSIS — Z23 Encounter for immunization: Secondary | ICD-10-CM

## 2013-06-13 ENCOUNTER — Other Ambulatory Visit: Payer: Self-pay | Admitting: Internal Medicine

## 2013-06-16 ENCOUNTER — Other Ambulatory Visit: Payer: Self-pay | Admitting: Internal Medicine

## 2013-07-21 ENCOUNTER — Other Ambulatory Visit (INDEPENDENT_AMBULATORY_CARE_PROVIDER_SITE_OTHER): Payer: Medicare Other

## 2013-07-21 ENCOUNTER — Encounter: Payer: Self-pay | Admitting: Internal Medicine

## 2013-07-21 ENCOUNTER — Ambulatory Visit (INDEPENDENT_AMBULATORY_CARE_PROVIDER_SITE_OTHER): Payer: Medicare Other | Admitting: Internal Medicine

## 2013-07-21 ENCOUNTER — Telehealth: Payer: Self-pay | Admitting: Internal Medicine

## 2013-07-21 VITALS — BP 124/86 | HR 60 | Temp 97.9°F | Ht 70.0 in | Wt 206.0 lb

## 2013-07-21 DIAGNOSIS — E785 Hyperlipidemia, unspecified: Secondary | ICD-10-CM

## 2013-07-21 DIAGNOSIS — E039 Hypothyroidism, unspecified: Secondary | ICD-10-CM

## 2013-07-21 DIAGNOSIS — I10 Essential (primary) hypertension: Secondary | ICD-10-CM

## 2013-07-21 DIAGNOSIS — H9190 Unspecified hearing loss, unspecified ear: Secondary | ICD-10-CM | POA: Insufficient documentation

## 2013-07-21 DIAGNOSIS — H9193 Unspecified hearing loss, bilateral: Secondary | ICD-10-CM

## 2013-07-21 LAB — LIPID PANEL
Cholesterol: 143 mg/dL (ref 0–200)
HDL: 56.5 mg/dL (ref >39.00)
LDL Cholesterol: 74 mg/dL (ref 0–99)
TRIGLYCERIDES: 65 mg/dL (ref 0.0–149.0)
Total CHOL/HDL Ratio: 3
VLDL: 13 mg/dL (ref 0.0–40.0)

## 2013-07-21 LAB — HEPATIC FUNCTION PANEL
ALBUMIN: 4.2 g/dL (ref 3.5–5.2)
ALT: 28 U/L (ref 0–53)
AST: 25 U/L (ref 0–37)
Alkaline Phosphatase: 52 U/L (ref 39–117)
Bilirubin, Direct: 0.1 mg/dL (ref 0.0–0.3)
TOTAL PROTEIN: 6.9 g/dL (ref 6.0–8.3)
Total Bilirubin: 0.6 mg/dL (ref 0.3–1.2)

## 2013-07-21 LAB — TSH: TSH: 7.17 u[IU]/mL — ABNORMAL HIGH (ref 0.35–5.50)

## 2013-07-21 MED ORDER — LEVOTHYROXINE SODIUM 75 MCG PO TABS: ORAL_TABLET | ORAL | Status: DC

## 2013-07-21 MED ORDER — LOSARTAN POTASSIUM 50 MG PO TABS: ORAL_TABLET | ORAL | Status: DC

## 2013-07-21 NOTE — Patient Instructions (Addendum)
Please remember to go to the lab   department downstairs for your tests - we will call you with the results when they are available.  Please see patient coordinator before you leave today  to schedule audiology evaluation   Please schedule a follow up office visit in 6 weeks, call sooner if needed to see Tammy for MMSE and recheck tsh on synthroid up from 75 to 100 LDL only 65 ? If Zocor contributing to some of his symptoms > try 20 mg per day

## 2013-07-21 NOTE — Assessment & Plan Note (Signed)
Adequate control on present rx, reviewed > no change in rx needed   

## 2013-07-21 NOTE — Telephone Encounter (Signed)
Spoke with patient-he states he needed his Losartan and Synthroid refills sent to [facility]. Pt aware that I have sent refills and to call us if any other questions or concerns.

## 2013-07-21 NOTE — Progress Notes (Signed)
Subjective:    Patient ID: [name], male    DOB: 10-09-1929    MRN: [id number]    Brief patient profile:  25 yowm with history of hypertenision, hyperlipidemia, and hypothyroidism.    History of Present Illness  July 19, 2008 cpx recovering from sarcoma surgery at [facility] and has been referred to Oncology and planning to start chemo with extensive labs and xrays there in 12/09.   December 30, 2008 ov for eval of orthostatic lightheadedness resolved off amlodipine for recheck bp doing well now. rec stop amlodipine and no more spells ok self monitoring   Admit [facility] 11/11-14/2011 syncope, hyponatremia, nl echo and MRA of brain > d/c'd hctz      04/25/2011 f/u ov/Naim Murtha CPX some urinary freq no hesitation or urgency or nocturia. rec Add amlodipine 5mg  one each am - hold this if light headed standing I would defer to Medical Oncologist re what studies for surveillance for recurrence of cancer   06/26/2011 f/u ov/Brighid Koch for f/u hbp/mild cri cc L knee pain x one month attributed to more climbing steps, no swelling or stiffness.  Has not tried advil yet. Also c/o mild chronic hearing loss s tinnitis or ear pain. rec Take advil up to 4 with meals as needed and if not better within a week or two you to be seen by [name] or [name].  Dr Constance Holster and Wilburn Cornelia are ENT doctors we can refer you to if desired for hearing loss      07/21/2013 f/u ov/Zadkiel Dragan re: hbp, chol, low thyroid Chief Complaint  Patient presents with  . Follow-up    Pt states doing well and denies any co's today.  very inactive/ no hobbies, denies feeling blue although wife concerned he's listless, depressed  Also c/o hearing loss bilaterally   No obvious day to day or daytime variabilty or assoc chronic cough or cp or chest tightness, subjective wheeze overt sinus or hb symptoms. No unusual exp hx or h/o childhood pna/ asthma or knowledge of premature birth.  Sleeping ok without nocturnal  or early am exacerbation  of  respiratory  c/o's or need for noct saba. Also denies any obvious fluctuation of symptoms with weather or environmental changes or other aggravating or alleviating factors except as outlined above   Current Medications, Allergies, Complete Past Medical History, Past Surgical History, Family History, and Social History were reviewed in [employer] record.  ROS  The following are not active complaints unless bolded sore throat, dysphagia, dental problems, itching, sneezing,  nasal congestion or excess/ purulent secretions, ear ache,   fever, chills, sweats, unintended wt loss, pleuritic or exertional cp, hemoptysis,  orthopnea pnd or leg swelling, presyncope, palpitations, heartburn, abdominal pain, anorexia, nausea, vomiting, diarrhea  or change in bowel or urinary habits, change in stools or urine, dysuria,hematuria,  rash, arthralgias, visual complaints, headache, numbness weakness or ataxia or problems with walking or coordination,  change in mood/affect or memory.                   Past Medical History:  MICROSCOPIC HEMATURIA (ICD-599.72)...................................................Marland KitchenWrenn  HYPERTENSIVE CARDIOVASCULAR DISEASE, BENIGN (ICD-402.10)  ONYCHOMYCOSIS (ICD-110.1)  DIVERTICULOSIS.........................................................................................Marland KitchenFuller Plan  - See colonscopy 03/2000  HYPERLIPIDEMIA (ICD-272.4)  - Target LDL < 70 (hbp, male, pos fm hx/ pos mrangiogram 08/2003 ascvd  SLEEP APNEA, OBSTRUCTIVE (ICD-327.23)  OBESITY (ICD-278.00)  - Target wt = 208 for BMI < 30  HYPOTHYROIDISM (ICD-244.9)  SEIZURE DISORDER (ICD-780.39)  CARDIOVASCULAR DISEASE (ICD-429.2)  HYPERTENSION (ICD-401.9)  Sarcoma left  leg........................................................................................................Marland KitchenWard, W [facility])  - 07/14/08 sarcoma removal left femur > last rx July 2010 > CR by scans 07/2009 due for repeat 05/2011 -  Chemo complete 12/2008  Memory Loss indolent onset 2011 - CT Head February 20, 2010 >>> Atrophy and chronic microvascular ischemia. No acute abnormality. - MMSE 03/02/10 30/30 HEALTH MAINTENANCE.............................................................................................Marland KitchenWert  - Pneumovax 04/1999  - DT 04/2006  - CPX  06/11/2012          Objective:   Physical Exam   wt 214 July 19, 2008 >  06/26/2011 209>  01/03/2012  206>06/11/2012  203> 207 07/30/2012 > 07/21/2013 206  Ambulatory healthy appearing in no acute distress.  Does not appear depressed  HEENT: nl dentition, turbinates, and orophanx. Nl external ear canals with mod wax on R none on L  without cough reflex/ grossly nl hearing  Neck without JVD/Nodes/TM  Lungs clear to A and P bilaterally without cough on insp or exp maneuvers  RRR no s3 or murmur or increase in P2  Abd soft and benign with nl excursion in the supine position. No bruits or organomegaly  Ext warm without calf tenderness, cyanosis clubbing.  MS minimal crepitance L knee, no effusion or instability/ nl gait./ nl ROM        Assessment & Plan:

## 2013-07-21 NOTE — Assessment & Plan Note (Signed)
Lab Results  Component Value Date   TSH 7.17* 07/21/2013    On 75 mcg per day so increased to 100 and recheck in 6 weeks

## 2013-07-22 ENCOUNTER — Other Ambulatory Visit: Payer: Self-pay | Admitting: Internal Medicine

## 2013-07-22 ENCOUNTER — Telehealth: Payer: Self-pay | Admitting: *Deleted

## 2013-07-22 MED ORDER — LEVOTHYROXINE SODIUM 100 MCG PO TABS: 100.0000 ug | ORAL_TABLET | Freq: Every day | ORAL | Status: DC

## 2013-07-22 NOTE — Telephone Encounter (Signed)
Message copied by Rosana Berger on Wed Jul 22, 2013  9:00 AM ------      Message from: Christinia Gully B      Created: Wed Jul 22, 2013  6:12 AM       zocor dose excessive and could be contributing to some of his symptoms so reduce the dose to 20 mg (one half of 40) daily  ------

## 2013-07-22 NOTE — Telephone Encounter (Signed)
Spoke with the pt and notified of recs per MW He verbalized understanding  MAR updated

## 2013-07-22 NOTE — Assessment & Plan Note (Addendum)
Lab Results  Component Value Date   CHOL 143 07/21/2013   HDL 56.50 07/21/2013   LDLCALC 74 07/21/2013   TRIG 65.0 07/21/2013   CHOLHDL 3 07/21/2013    - - Target LDL < 70 (hbp, male, pos fm hx/ pos mrangiogram 08/2003 ascvd  Adequate control on present rx = zocor 40 ? Contributing to symptoms > will reduce to 20 mg daily

## 2013-07-22 NOTE — Progress Notes (Signed)
Quick Note:  Spoke with pt and notified of results per Dr. [name]. Pt verbalized understanding and denied any questions.  ______ 

## 2013-07-22 NOTE — Assessment & Plan Note (Signed)
No sign wax obst > referred to audiology

## 2013-08-12 ENCOUNTER — Other Ambulatory Visit: Payer: Self-pay | Admitting: Internal Medicine

## 2013-08-21 ENCOUNTER — Other Ambulatory Visit: Payer: Self-pay | Admitting: Internal Medicine

## 2013-09-01 ENCOUNTER — Ambulatory Visit: Payer: Medicare Other | Admitting: Internal Medicine

## 2013-09-07 ENCOUNTER — Encounter (INDEPENDENT_AMBULATORY_CARE_PROVIDER_SITE_OTHER): Payer: Self-pay

## 2013-09-07 ENCOUNTER — Encounter: Payer: Self-pay | Admitting: Internal Medicine

## 2013-09-07 ENCOUNTER — Ambulatory Visit (INDEPENDENT_AMBULATORY_CARE_PROVIDER_SITE_OTHER): Payer: Medicare Other | Admitting: Internal Medicine

## 2013-09-07 ENCOUNTER — Other Ambulatory Visit (INDEPENDENT_AMBULATORY_CARE_PROVIDER_SITE_OTHER): Payer: Medicare Other

## 2013-09-07 VITALS — BP 128/82 | HR 73 | Temp 97.7°F | Ht 71.0 in | Wt 204.0 lb

## 2013-09-07 DIAGNOSIS — E039 Hypothyroidism, unspecified: Secondary | ICD-10-CM

## 2013-09-07 DIAGNOSIS — R5381 Other malaise: Secondary | ICD-10-CM

## 2013-09-07 DIAGNOSIS — I10 Essential (primary) hypertension: Secondary | ICD-10-CM

## 2013-09-07 DIAGNOSIS — E785 Hyperlipidemia, unspecified: Secondary | ICD-10-CM

## 2013-09-07 DIAGNOSIS — R5383 Other fatigue: Secondary | ICD-10-CM

## 2013-09-07 LAB — LIPID PANEL
CHOLESTEROL: 147 mg/dL (ref 0–200)
HDL: 54.1 mg/dL (ref >39.00)
LDL CALC: 74 mg/dL (ref 0–99)
Total CHOL/HDL Ratio: 3
Triglycerides: 93 mg/dL (ref 0.0–149.0)
VLDL: 18.6 mg/dL (ref 0.0–40.0)

## 2013-09-07 LAB — TSH: TSH: 1.37 u[IU]/mL (ref 0.35–5.50)

## 2013-09-07 NOTE — Patient Instructions (Addendum)
Please remember to go to the lab   department downstairs for your tests - we will call you with the results when they are available.  Please see patient coordinator before you leave today  to schedule psychology evaluation  Please schedule a follow up visit in 3 months but call sooner if needed - if you would like to shift to a primary care doctor let us know and we will try to help you make it happen and cover you until you established for all your needs

## 2013-09-07 NOTE — Assessment & Plan Note (Signed)
Adequate control on present rx, reviewed > no change in rx needed   

## 2013-09-07 NOTE — Assessment & Plan Note (Addendum)
TSH is nl > Since this is situational and related to son's divorce prefer counseling and assessment first then consider ssi next option

## 2013-09-07 NOTE — Assessment & Plan Note (Addendum)
Lab Results  Component Value Date   TSH 1.37 09/07/2013      Adequate control on present rx, reviewed > no change in rx needed

## 2013-09-07 NOTE — Progress Notes (Signed)
Subjective:    Patient ID: [name], male    DOB: 18-Sep-1929    MRN: [id number]    Brief patient profile:  9 yowm with history of hypertenision, hyperlipidemia, and hypothyroidism.    History of Present Illness  July 19, 2008 cpx recovering from sarcoma surgery at [facility] and has been referred to Oncology and planning to start chemo with extensive labs and xrays there in 12/09.   December 30, 2008 ov for eval of orthostatic lightheadedness resolved off amlodipine for recheck bp doing well now. rec stop amlodipine and no more spells ok self monitoring   Admit [facility] 11/11-14/2011 syncope, hyponatremia, nl echo and MRA of brain > d/c'd hctz      04/25/2011 f/u ov/Yashvi Jasinski CPX some urinary freq no hesitation or urgency or nocturia. rec Add amlodipine 5mg  one each am - hold this if light headed standing I would defer to Medical Oncologist re what studies for surveillance for recurrence of cancer   06/26/2011 f/u ov/Keeon Zurn for f/u hbp/mild cri cc L knee pain x one month attributed to more climbing steps, no swelling or stiffness.  Has not tried advil yet. Also c/o mild chronic hearing loss s tinnitis or ear pain. rec Take advil up to 4 with meals as needed and if not better within a week or two you to be seen by [name] or [name].  Dr Constance Holster and Wilburn Cornelia are ENT doctors we can refer you to if desired for hearing loss      07/21/2013 f/u ov/Alayha Babineaux re: hbp, chol, low thyroid Chief Complaint  Patient presents with  . Follow-up    Pt states doing well and denies any co's today.  very inactive/ no hobbies, denies feeling blue although wife concerned he's listless, depressed  Also c/o hearing loss bilaterally. rec Please see patient coordinator before you leave today  to schedule audiology evaluation > hearing aids Synthroid up from 75 to 100 LDL only 65 ? If Zocor contributing to some of his symptoms > try 20 mg per day    09/07/2013 f/u ov/Shadow Schedler re: hbp/hyperlipdemia/hypothyroid Chief  Complaint  Patient presents with  . Follow-up    Pt states that he is doing well and denies any new co's today.   very stressed with son's divorce, no hobbies.  Not limited by breathing from desired activities / no ex cp/ sob/tia/claudication Sleeping well   No obvious day to day or daytime variabilty or assoc chronic cough  or chest tightness, subjective wheeze overt sinus or hb symptoms. No unusual exp hx or h/o childhood pna/ asthma or knowledge of premature birth.  Sleeping ok without nocturnal  or early am exacerbation  of respiratory  c/o's or need for noct saba. Also denies any obvious fluctuation of symptoms with weather or environmental changes or other aggravating or alleviating factors except as outlined above   Current Medications, Allergies, Complete Past Medical History, Past Surgical History, Family History, and Social History were reviewed in [employer] record.  ROS  The following are not active complaints unless bolded sore throat, dysphagia, dental problems, itching, sneezing,  nasal congestion or excess/ purulent secretions, ear ache,   fever, chills, sweats, unintended wt loss, pleuritic or exertional cp, hemoptysis,  orthopnea pnd or leg swelling, presyncope, palpitations, heartburn, abdominal pain, anorexia, nausea, vomiting, diarrhea  or change in bowel or urinary habits, change in stools or urine, dysuria,hematuria,  rash, arthralgias, visual complaints, headache, numbness weakness or ataxia or problems with walking or coordination,  change in  mood/affect or memory.                   Past Medical History:  MICROSCOPIC HEMATURIA (ICD-599.72)...................................................Marland KitchenWrenn  HYPERTENSIVE CARDIOVASCULAR DISEASE, BENIGN (ICD-402.10)  ONYCHOMYCOSIS (ICD-110.1)  DIVERTICULOSIS.........................................................................................Marland KitchenFuller Plan  - See colonscopy 03/2000  HYPERLIPIDEMIA  (ICD-272.4)  - Target LDL < 70 (hbp, male, pos fm hx/ pos mrangiogram 08/2003 ascvd  SLEEP APNEA, OBSTRUCTIVE (ICD-327.23)  OBESITY (ICD-278.00)  - Target wt = 208 for BMI < 30  HYPOTHYROIDISM (ICD-244.9)  SEIZURE DISORDER (ICD-780.39)  CARDIOVASCULAR DISEASE (ICD-429.2)  HYPERTENSION (ICD-401.9)  Sarcoma left leg........................................................................................................Marland KitchenWard, [facility])  - 07/14/08 sarcoma removal left femur > last rx July 2010 > CR by scans 07/2009 due for repeat 05/2011 - Chemo complete 12/2008  Memory Loss indolent onset 2011 - CT Head February 20, 2010 >>> Atrophy and chronic microvascular ischemia. No acute abnormality. - MMSE 03/02/10 30/30 HEALTH MAINTENANCE.............................................................................................Marland KitchenWert  - Pneumovax 04/1999  - DT 04/2006  - CPX  06/11/2012          Objective:   Physical Exam   wt 214 July 19, 2008 >  06/26/2011 209>  01/03/2012  206>06/11/2012  203> 207 07/30/2012 > 07/21/2013 206 > 09/07/2013  204  Ambulatory healthy appearing in no acute distress.  Does not appear depressed  HEENT: nl dentition, turbinates, and orophanx. Nl external ear canals with mod wax on R none on L  without cough reflex/ grossly nl hearing  Neck without JVD/Nodes/TM  Lungs clear to A and P bilaterally without cough on insp or exp maneuvers  RRR no s3 or murmur or increase in P2  Abd soft and benign with nl excursion in the supine position. No bruits or organomegaly  Ext warm without calf tenderness, cyanosis clubbing.           Assessment & Plan:

## 2013-09-09 NOTE — Progress Notes (Signed)
Quick Note:  Spoke with pt and notified of results per Dr. [name]. Pt verbalized understanding and denied any questions.  ______ 

## 2013-09-09 NOTE — Assessment & Plan Note (Signed)
- -   Target LDL < 70 (hbp, male, pos fm hx/ pos mrangiogram 08/2003 ascvd   Lab Results  Component Value Date   CHOL 147 09/07/2013   HDL 54.10 09/07/2013   LDLCALC 74 09/07/2013   TRIG 93.0 09/07/2013   CHOLHDL 3 09/07/2013    Adequate control on present rx, reviewed > no change in rx needed

## 2013-10-27 ENCOUNTER — Ambulatory Visit (INDEPENDENT_AMBULATORY_CARE_PROVIDER_SITE_OTHER): Payer: No Typology Code available for payment source | Admitting: Psychiatry

## 2013-10-27 DIAGNOSIS — F329 Major depressive disorder, single episode, unspecified: Secondary | ICD-10-CM

## 2013-10-27 DIAGNOSIS — F3289 Other specified depressive episodes: Secondary | ICD-10-CM

## 2013-10-29 ENCOUNTER — Ambulatory Visit: Payer: Medicare Other | Admitting: Psychiatry

## 2013-12-10 ENCOUNTER — Encounter: Payer: Self-pay | Admitting: Internal Medicine

## 2013-12-10 ENCOUNTER — Ambulatory Visit (INDEPENDENT_AMBULATORY_CARE_PROVIDER_SITE_OTHER): Payer: Medicare Other | Admitting: Internal Medicine

## 2013-12-10 ENCOUNTER — Other Ambulatory Visit (INDEPENDENT_AMBULATORY_CARE_PROVIDER_SITE_OTHER): Payer: Medicare Other

## 2013-12-10 VITALS — BP 132/80 | HR 60 | Temp 98.0°F | Ht 71.0 in | Wt 205.0 lb

## 2013-12-10 DIAGNOSIS — N259 Disorder resulting from impaired renal tubular function, unspecified: Secondary | ICD-10-CM

## 2013-12-10 DIAGNOSIS — L259 Unspecified contact dermatitis, unspecified cause: Secondary | ICD-10-CM

## 2013-12-10 DIAGNOSIS — E785 Hyperlipidemia, unspecified: Secondary | ICD-10-CM

## 2013-12-10 DIAGNOSIS — E039 Hypothyroidism, unspecified: Secondary | ICD-10-CM

## 2013-12-10 DIAGNOSIS — L309 Dermatitis, unspecified: Secondary | ICD-10-CM

## 2013-12-10 DIAGNOSIS — I10 Essential (primary) hypertension: Secondary | ICD-10-CM

## 2013-12-10 DIAGNOSIS — L0390 Cellulitis, unspecified: Secondary | ICD-10-CM | POA: Insufficient documentation

## 2013-12-10 LAB — HEPATIC FUNCTION PANEL
ALBUMIN: 4.1 g/dL (ref 3.5–5.2)
ALT: 22 U/L (ref 0–53)
AST: 22 U/L (ref 0–37)
Alkaline Phosphatase: 55 U/L (ref 39–117)
Bilirubin, Direct: 0.2 mg/dL (ref 0.0–0.3)
Total Bilirubin: 0.7 mg/dL (ref 0.2–1.2)
Total Protein: 6.7 g/dL (ref 6.0–8.3)

## 2013-12-10 LAB — LIPID PANEL
CHOLESTEROL: 139 mg/dL (ref 0–200)
HDL: 49.5 mg/dL (ref >39.00)
LDL Cholesterol: 71 mg/dL (ref 0–99)
Total CHOL/HDL Ratio: 3
Triglycerides: 94 mg/dL (ref 0.0–149.0)
VLDL: 18.8 mg/dL (ref 0.0–40.0)

## 2013-12-10 LAB — BASIC METABOLIC PANEL
BUN: 17 mg/dL (ref 6–23)
CHLORIDE: 99 meq/L (ref 96–112)
CO2: 28 mEq/L (ref 19–32)
Calcium: 9.4 mg/dL (ref 8.4–10.5)
Creatinine, Ser: 1.1 mg/dL (ref 0.4–1.5)
GFR: 65.45 mL/min (ref >60.00)
GLUCOSE: 97 mg/dL (ref 70–99)
Potassium: 4.9 mEq/L (ref 3.5–5.1)
SODIUM: 133 meq/L — AB (ref 135–145)

## 2013-12-10 LAB — TSH: TSH: 0.66 u[IU]/mL (ref 0.35–4.50)

## 2013-12-10 MED ORDER — HYDROXYZINE HCL 25 MG PO TABS: ORAL_TABLET | ORAL | Status: DC

## 2013-12-10 MED ORDER — PREDNISONE 10 MG PO TABS: ORAL_TABLET | ORAL | Status: DC

## 2013-12-10 NOTE — Patient Instructions (Addendum)
Stop aleve   For pain try tylenol then advil if needed  For itching > hydroxyzine up 50 mg every 4 hours (2 every 4 hours)  Prednisone 10 mg take  4 each am x 2 days,   2 each am x 2 days,  1 each am x 2 days and stop   If not better by Monday the 1st June call for dermatology referral.

## 2013-12-10 NOTE — Progress Notes (Signed)
Subjective:    Patient ID: [name], male    DOB: 21-Jun-1930    MRN: [id number]    Brief patient profile:  26 yowm with history of hypertenision, hyperlipidemia, and hypothyroidism.    History of Present Illness  July 19, 2008 cpx recovering from sarcoma surgery at [facility] and has been referred to Oncology and planning to start chemo with extensive labs and xrays there in 12/09.   December 30, 2008 ov for eval of orthostatic lightheadedness resolved off amlodipine for recheck bp doing well now. rec stop amlodipine and no more spells ok self monitoring   Admit [facility] 11/11-14/2011 syncope, hyponatremia, nl echo and MRA of brain > d/c'd hctz      04/25/2011 f/u ov/[name] CPX some urinary freq no hesitation or urgency or nocturia. rec Add amlodipine 5mg  one each am - hold this if light headed standing I would defer to Medical Oncologist re what studies for surveillance for recurrence of cancer   06/26/2011 f/u ov/[name] for f/u hbp/mild cri cc L knee pain x one month attributed to more climbing steps, no swelling or stiffness.  Has not tried advil yet. Also c/o mild chronic hearing loss s tinnitis or ear pain. rec Take advil up to 4 with meals as needed and if not better within a week or two you to be seen by [name] or [name].  Dr [name] and [name] are ENT doctors we can refer you to if desired for hearing loss      07/21/2013 f/u ov/[name] re: hbp, chol, low thyroid Chief Complaint  Patient presents with  . Follow-up    Pt states doing well and denies any co's today.  very inactive/ no hobbies, denies feeling blue although wife concerned he's listless, depressed  Also c/o hearing loss bilaterally. rec Please see patient coordinator before you leave today  to schedule audiology evaluation > hearing aids Synthroid up from 75 to 100 LDL only 65 ? If Zocor contributing to some of his symptoms > try 20 mg per day    09/07/2013 f/u ov/[name] re: hbp/hyperlipdemia/hypothyroid Chief  Complaint  Patient presents with  . Follow-up    Pt states that he is doing well and denies any new co's today.   very stressed with son's divorce, no hobbies.  Not limited by breathing from desired activities / no ex cp/ sob/tia/claudication Sleeping well  rec psychology eval > completed   12/10/2013 f/u ov/[name] re: hbp/ hyperlipidemia/hypothyroid / new rash  Chief Complaint  Patient presents with  . Follow-up    Pt c/o itchy rash "all over" x 1 wk. He states this is not painful.    bad abute onset x 2 week pts lower central back pain different from anything before, very positional, acute onset s provocation, no radiation to legs, 100% resolved with aleve then started with rash legs > arms and trunk, very pruritic, has no exp exp mwoing grass, has not tried benadryl yet.     No obvious day to day or daytime variabilty or assoc chronic cough  or chest tightness, subjective wheeze overt sinus or hb symptoms. No unusual exp hx or h/o childhood pna/ asthma or knowledge of premature birth.  Sleeping ok without nocturnal  or early am exacerbation  of respiratory  c/o's or need for noct saba. Also denies any obvious fluctuation of symptoms with weather or environmental changes or other aggravating or alleviating factors except as outlined above   Current Medications, Allergies, Complete Past Medical History, Past Surgical History,  Family History, and Social History were reviewed in [employer] record.  ROS  The following are not active complaints unless bolded sore throat, dysphagia, dental problems, itching, sneezing,  nasal congestion or excess/ purulent secretions, ear ache,   fever, chills, sweats, unintended wt loss, pleuritic or exertional cp, hemoptysis,  orthopnea pnd or leg swelling, presyncope, palpitations, heartburn, abdominal pain, anorexia, nausea, vomiting, diarrhea  or change in bowel or urinary habits, change in stools or urine, dysuria,hematuria,  rash,  arthralgias, visual complaints, headache, numbness weakness or ataxia or problems with walking or coordination,  change in mood/affect or memory.                   Past Medical History:  MICROSCOPIC HEMATURIA (ICD-599.72)...................................................Marland KitchenWrenn  HYPERTENSIVE CARDIOVASCULAR DISEASE, BENIGN (ICD-402.10)  ONYCHOMYCOSIS (ICD-110.1)  DIVERTICULOSIS.........................................................................................Marland KitchenFuller [name]  - See colonscopy 03/2000  HYPERLIPIDEMIA (ICD-272.4)  - Target LDL < 70 (hbp, male, pos fm hx/ pos mrangiogram 08/2003 ascvd  SLEEP APNEA, OBSTRUCTIVE (ICD-327.23)  OBESITY (ICD-278.00)  - Target wt = 208 for BMI < 30  HYPOTHYROIDISM (ICD-244.9)  SEIZURE DISORDER (ICD-780.39)  CARDIOVASCULAR DISEASE (ICD-429.2)  HYPERTENSION (ICD-401.9)  Sarcoma left leg........................................................................................................Marland KitchenWard, [name])  - 07/14/08 sarcoma removal left femur > last rx July 2010 > CR by scans 07/2009 due for repeat 05/2011 - Chemo complete 12/2008  Memory Loss indolent onset 2011 - CT Head February 20, 2010 >>> Atrophy and chronic microvascular ischemia. No acute abnormality. - MMSE 03/02/10 30/30 HEALTH MAINTENANCE.............................................................................................Marland KitchenWert  - Pneumovax 04/1999  - DT 04/2006  - CPX  06/11/2012          Objective:   Physical Exam   wt 214 July 19, 2008 >  06/26/2011 209>  01/03/2012  206>06/11/2012  203> 207 07/30/2012 > 07/21/2013 206 > 09/07/2013  204 > 12/10/2013   205   Ambulatory healthy appearing in no acute distress.  Does not appear depressed  HEENT: nl dentition, turbinates, and orophanx. Nl external ear canals with mod wax on R none on L  without cough reflex/ grossly nl hearing  Neck without JVD/Nodes/TM  Lungs clear to A and P bilaterally without cough on insp or exp maneuvers   RRR no s3 or murmur or increase in P2  Abd soft and benign with nl excursion in the supine position. No bruits or organomegaly  Ext warm without calf tenderness, cyanosis clubbing.  Skin diffuse maculopapular erythematous rash             Assessment & [name]:

## 2013-12-11 NOTE — Assessment & Plan Note (Signed)
C/w Type IV hypersensitivity ? Source - doubt aleve caused this but should avoid  rec  Vistaril/ Prednisone 10 mg take  4 each am x 2 days,   2 each am x 2 days,  1 each am x 2 days and stop and derm eval 12/14/13 if not better

## 2013-12-11 NOTE — Assessment & Plan Note (Signed)
  Recent Labs Lab 12/10/13 1023  NA 133*  K 4.9  CL 99  CO2 28  BUN 17  CREATININE 1.1  GLUCOSE 97    Adequate control on present rx, reviewed > no change in rx needed

## 2013-12-11 NOTE — Assessment & Plan Note (Signed)
Lab Results  Component Value Date   TSH 0.66 12/10/2013     Adequate control on present rx, reviewed > no change in rx needed

## 2013-12-11 NOTE — Assessment & Plan Note (Signed)
- -   Target LDL < 70 (hbp, male, pos fm hx/ pos mrangiogram 08/2003 ascvd   zocor @ 40 mg per day  Lab Results  Component Value Date   CHOL 139 12/10/2013   HDL 49.50 12/10/2013   LDLCALC 71 12/10/2013   TRIG 94.0 12/10/2013   CHOLHDL 3 12/10/2013     Adequate control on present rx, reviewed > no change in rx needed

## 2013-12-11 NOTE — Assessment & Plan Note (Signed)
Lab Results  Component Value Date   CREATININE 1.1 12/10/2013   CREATININE 1.1 06/11/2012   CREATININE 1.1 01/03/2012     Adequate control on present rx, reviewed > no change in rx needed

## 2013-12-11 NOTE — Progress Notes (Signed)
Quick Note:  LMTCB ______ 

## 2013-12-15 NOTE — Progress Notes (Signed)
Quick Note:  Spoke with pt and notified of results per Dr. [name]. Pt verbalized understanding and denied any questions.  ______ 

## 2013-12-22 ENCOUNTER — Other Ambulatory Visit (INDEPENDENT_AMBULATORY_CARE_PROVIDER_SITE_OTHER): Payer: Medicare Other

## 2013-12-22 ENCOUNTER — Ambulatory Visit (INDEPENDENT_AMBULATORY_CARE_PROVIDER_SITE_OTHER): Payer: Medicare Other | Admitting: Adult Health

## 2013-12-22 ENCOUNTER — Encounter: Payer: Self-pay | Admitting: Adult Health

## 2013-12-22 VITALS — BP 122/68 | HR 65 | Temp 98.0°F | Ht 71.0 in | Wt 205.8 lb

## 2013-12-22 DIAGNOSIS — L309 Dermatitis, unspecified: Secondary | ICD-10-CM

## 2013-12-22 DIAGNOSIS — R21 Rash and other nonspecific skin eruption: Secondary | ICD-10-CM

## 2013-12-22 DIAGNOSIS — L259 Unspecified contact dermatitis, unspecified cause: Secondary | ICD-10-CM

## 2013-12-22 LAB — CBC WITH DIFFERENTIAL/PLATELET
Basophils Absolute: 0 10*3/uL (ref 0.0–0.1)
Basophils Relative: 0.2 % (ref 0.0–3.0)
EOS ABS: 0.4 10*3/uL (ref 0.0–0.7)
Eosinophils Relative: 3.9 % (ref 0.0–5.0)
HEMATOCRIT: 36.8 % — AB (ref 39.0–52.0)
HEMOGLOBIN: 12.6 g/dL — AB (ref 13.0–17.0)
LYMPHS ABS: 1.4 10*3/uL (ref 0.7–4.0)
Lymphocytes Relative: 15.3 % (ref 12.0–46.0)
MCHC: 34.3 g/dL (ref 30.0–36.0)
MCV: 95.6 fl (ref 78.0–100.0)
MONOS PCT: 7.4 % (ref 3.0–12.0)
Monocytes Absolute: 0.7 10*3/uL (ref 0.1–1.0)
NEUTROS ABS: 6.9 10*3/uL (ref 1.4–7.7)
Neutrophils Relative %: 73.2 % (ref 43.0–77.0)
PLATELETS: 232 10*3/uL (ref 150.0–400.0)
RBC: 3.85 Mil/uL — ABNORMAL LOW (ref 4.22–5.81)
RDW: 13.1 % (ref 11.5–15.5)
WBC: 9.4 10*3/uL (ref 4.0–10.5)

## 2013-12-22 MED ORDER — CEPHALEXIN 500 MG PO CAPS: 500.0000 mg | ORAL_CAPSULE | Freq: Four times a day (QID) | ORAL | Status: AC

## 2013-12-22 NOTE — Assessment & Plan Note (Signed)
Worsening Dermatitis , unresponsive to steroids  ? Secondary to cellultis in left leg from scratching -tx w/ abx , check wound cx  Check cbc today  Advised to hold zocor until seen back -doubt this is culprit   Plan  Keflex 500mg  Four times a day  For 10 days  Wound culture today .  Keep leg elevated  Hold Zocor for now .  Begin Zyrtec 10mg  At bedtime  For 5 days  Beign Pepcid 20mg  At bedtime  For 5 days .  We are referring you to Dermatology ASAP .  Wash area daily with sterile wound wash /water , pat dry .  Please contact office for sooner follow up if symptoms do not improve or worsen or seek emergency care  Follow up Dr. Melvyn Novas  In 2-3 weeks and As needed   Labs today

## 2013-12-22 NOTE — Patient Instructions (Addendum)
Keflex 500mg  Four times a day  For 10 days  Wound culture today .  Keep leg elevated  Hold Zocor for now .  Begin Zyrtec 10mg  At bedtime  For 5 days  Beign Pepcid 20mg  At bedtime  For 5 days .  We are referring you to Dermatology ASAP .  Wash area daily with sterile wound wash /water , pat dry .  Please contact office for sooner follow up if symptoms do not improve or worsen or seek emergency care  Follow up Dr. Melvyn Novas  In 2-3 weeks and As needed   Labs today

## 2013-12-22 NOTE — Progress Notes (Signed)
Subjective:    Patient ID: [name], male    DOB: 02-17-1930    MRN: [id number]    Brief patient profile:  9 yowm with history of hypertenision, hyperlipidemia, and hypothyroidism.    History of Present Illness  July 19, 2008 cpx recovering from sarcoma surgery at [facility] and has been referred to Oncology and planning to start chemo with extensive labs and xrays there in 12/09.   December 30, 2008 ov for eval of orthostatic lightheadedness resolved off amlodipine for recheck bp doing well now. rec stop amlodipine and no more spells ok self monitoring   Admit [facility] 11/11-14/2011 syncope, hyponatremia, nl echo and MRA of brain > d/c'd hctz      04/25/2011 f/u ov/[name] CPX some urinary freq no hesitation or urgency or nocturia. rec Add amlodipine 5mg  one each am - hold this if light headed standing I would defer to Medical Oncologist re what studies for surveillance for recurrence of cancer   06/26/2011 f/u ov/[name] for f/u hbp/mild cri cc L knee pain x one month attributed to more climbing steps, no swelling or stiffness.  Has not tried advil yet. Also c/o mild chronic hearing loss s tinnitis or ear pain. rec Take advil up to 4 with meals as needed and if not better within a week or two you to be seen by [name] or [name].  Dr Constance Holster and Wilburn Cornelia are ENT doctors we can refer you to if desired for hearing loss      07/21/2013 f/u ov/[name] re: hbp, chol, low thyroid Chief Complaint  Patient presents with  . Follow-up    Pt states doing well and denies any co's today.  very inactive/ no hobbies, denies feeling blue although wife concerned he's listless, depressed  Also c/o hearing loss bilaterally. rec Please see patient coordinator before you leave today  to schedule audiology evaluation > hearing aids Synthroid up from 75 to 100 LDL only 65 ? If Zocor contributing to some of his symptoms > try 20 mg per day    09/07/2013 f/u ov/[name] re: hbp/hyperlipdemia/hypothyroid Chief  Complaint  Patient presents with  . Follow-up    Pt states that he is doing well and denies any new co's today.   very stressed with son's divorce, no hobbies.  Not limited by breathing from desired activities / no ex cp/ sob/tia/claudication Sleeping well  rec psychology eval > completed   12/10/2013 f/u ov/[name] re: hbp/ hyperlipidemia/hypothyroid / new rash  Chief Complaint  Patient presents with  . Follow-up    Pt c/o itchy rash "all over" x 1 wk. He states this is not painful.    bad abute onset x 2 week pts lower central back pain different from anything before, very positional, acute onset s provocation, no radiation to legs, 100% resolved with aleve then started with rash legs > arms and trunk, very pruritic, has no exp exp mwoing grass, has not tried benadryl yet.  >pred pack and hydroxine   12/22/2013 Acute OV  Returns for persistent rash.  Seen 2 weeks ago for pruritic rash unknown etiology .  Says noted a rash: red, raised, itching, that developed 2 weeks ago. Was seen in office , tx w/ prednisone  And hydroxine. No better and now left leg is worse with sigificant scaling, weeping and redness. No fever, n/v/d or dyspnea.  No recent travel, known insect bite, new meds. Was taken off NSAIDS last ov in case there was an interaction.  Labs showed bmet and  lft unrevealing.  Says left leg is more red, and swollen. No leg pain or weakness. No dyspnea or difficulty swallowing.  Bumps are very itchy .      Current Medications, Allergies, Complete Past Medical History, Past Surgical History, Family History, and Social History were reviewed in [employer] record.  ROS  The following are not active complaints unless bolded sore throat, dysphagia, dental problems, itching, sneezing,  nasal congestion or excess/ purulent secretions, ear ache,   fever, chills, sweats, unintended wt loss, pleuritic or exertional cp, hemoptysis,  orthopnea pnd or leg swelling,  presyncope, palpitations, heartburn, abdominal pain, anorexia, nausea, vomiting, diarrhea  or change in bowel or urinary habits, change in stools or urine, dysuria,hematuria,  , arthralgias, visual complaints, headache, numbness weakness or ataxia or problems with walking or coordination,  change in mood/affect or memory.                   Past Medical History:  MICROSCOPIC HEMATURIA (ICD-599.72)...................................................Marland KitchenWrenn  HYPERTENSIVE CARDIOVASCULAR DISEASE, BENIGN (ICD-402.10)  ONYCHOMYCOSIS (ICD-110.1)  DIVERTICULOSIS.........................................................................................Marland KitchenFuller Plan  - See colonscopy 03/2000  HYPERLIPIDEMIA (ICD-272.4)  - Target LDL < 70 (hbp, male, pos fm hx/ pos mrangiogram 08/2003 ascvd  SLEEP APNEA, OBSTRUCTIVE (ICD-327.23)  OBESITY (ICD-278.00)  - Target wt = 208 for BMI < 30  HYPOTHYROIDISM (ICD-244.9)  SEIZURE DISORDER (ICD-780.39)  CARDIOVASCULAR DISEASE (ICD-429.2)  HYPERTENSION (ICD-401.9)  Sarcoma left leg........................................................................................................Marland KitchenWard, [facility])  - 07/14/08 sarcoma removal left femur > last rx July 2010 > CR by scans 07/2009 due for repeat 05/2011 - Chemo complete 12/2008  Memory Loss indolent onset 2011 - CT Head February 20, 2010 >>> Atrophy and chronic microvascular ischemia. No acute abnormality. - MMSE 03/02/10 30/30 HEALTH MAINTENANCE.............................................................................................Marland KitchenWert  - Pneumovax 04/1999  - DT 04/2006  - CPX  06/11/2012          Objective:   Physical Exam   wt 214 July 19, 2008 >  06/26/2011 209>  01/03/2012  206>06/11/2012  203> 207 07/30/2012 > 07/21/2013 206 > 09/07/2013  204 > 12/10/2013   205 >205 12/22/2013   Ambulatory healthy appearing in no acute distress.  Does not appear depressed  HEENT: nl dentition, turbinates, and orophanx. Nl  external ear canals with mod wax on R none on L  without cough reflex/ grossly nl hearing  Neck without JVD/Nodes/TM  Lungs clear to A and P bilaterally without cough on insp or exp maneuvers  RRR no s3 or murmur or increase in P2  Abd soft and benign with nl excursion in the supine position. No bruits or organomegaly  Ext warm without calf tenderness, cyanosis clubbing.  Skin diffuse maculopapular erythematous rash -scattered lesions along trunk , extremities and neck  Along left  Lower extremity diffuse erythematous and hyperpigmented excoriated plaques w/ scaly border w/ areas of weeping. Surrounding edema noted.  Pulses intact and neg calf pain /neg homans sign.             Assessment & Plan:

## 2013-12-23 ENCOUNTER — Emergency Department (HOSPITAL_COMMUNITY)
Admission: EM | Admit: 2013-12-23 | Discharge: 2013-12-23 | Disposition: A | Discharge status: Home / Self Care | Payer: Medicare Other | Attending: Emergency Medicine | Admitting: Emergency Medicine

## 2013-12-23 ENCOUNTER — Encounter (HOSPITAL_COMMUNITY): Payer: Self-pay | Admitting: Emergency Medicine

## 2013-12-23 DIAGNOSIS — M7989 Other specified soft tissue disorders: Secondary | ICD-10-CM

## 2013-12-23 DIAGNOSIS — Z8669 Personal history of other diseases of the nervous system and sense organs: Secondary | ICD-10-CM | POA: Insufficient documentation

## 2013-12-23 DIAGNOSIS — R600 Localized edema: Secondary | ICD-10-CM

## 2013-12-23 DIAGNOSIS — Z7982 Long term (current) use of aspirin: Secondary | ICD-10-CM | POA: Insufficient documentation

## 2013-12-23 DIAGNOSIS — L02419 Cutaneous abscess of limb, unspecified: Secondary | ICD-10-CM | POA: Insufficient documentation

## 2013-12-23 DIAGNOSIS — Z8719 Personal history of other diseases of the digestive system: Secondary | ICD-10-CM | POA: Insufficient documentation

## 2013-12-23 DIAGNOSIS — E039 Hypothyroidism, unspecified: Secondary | ICD-10-CM | POA: Insufficient documentation

## 2013-12-23 DIAGNOSIS — I509 Heart failure, unspecified: Secondary | ICD-10-CM

## 2013-12-23 DIAGNOSIS — M79609 Pain in unspecified limb: Secondary | ICD-10-CM

## 2013-12-23 DIAGNOSIS — I110 Hypertensive heart disease with heart failure: Secondary | ICD-10-CM | POA: Insufficient documentation

## 2013-12-23 DIAGNOSIS — I2510 Atherosclerotic heart disease of native coronary artery without angina pectoris: Secondary | ICD-10-CM | POA: Insufficient documentation

## 2013-12-23 DIAGNOSIS — Z87891 Personal history of nicotine dependence: Secondary | ICD-10-CM | POA: Insufficient documentation

## 2013-12-23 DIAGNOSIS — E669 Obesity, unspecified: Secondary | ICD-10-CM

## 2013-12-23 DIAGNOSIS — R609 Edema, unspecified: Secondary | ICD-10-CM | POA: Insufficient documentation

## 2013-12-23 DIAGNOSIS — Z79899 Other long term (current) drug therapy: Secondary | ICD-10-CM | POA: Insufficient documentation

## 2013-12-23 DIAGNOSIS — L0390 Cellulitis, unspecified: Secondary | ICD-10-CM

## 2013-12-23 DIAGNOSIS — L03119 Cellulitis of unspecified part of limb: Secondary | ICD-10-CM

## 2013-12-23 MED ORDER — CEPHALEXIN 500 MG PO CAPS: 500.0000 mg | ORAL_CAPSULE | Freq: Four times a day (QID) | ORAL | Status: DC

## 2013-12-23 NOTE — ED Notes (Signed)
Pt presents to department for evaluation of L leg pain, swelling and rash. Was sent from PCP to r/o DVT. 5/10 pain upon arrival. Pt states leg feels very tight and warm. Pt is alert and oriented x4.

## 2013-12-23 NOTE — ED Notes (Signed)
Pt. reports progressing left lower/anterior leg rash with dry scaly skin/weeping for 2 weeks , advised by dermatologist to go to ER to rule out DVT , currently taking Keflex antibiotic .

## 2013-12-23 NOTE — Discharge Instructions (Signed)
Cellulitis Cellulitis is an infection of the skin and the tissue beneath it. The infected area is usually red and tender. Cellulitis occurs most often in the arms and lower legs.  CAUSES  Cellulitis is caused by bacteria that enter the skin through cracks or cuts in the skin. The most common types of bacteria that cause cellulitis are Staphylococcus and Streptococcus. SYMPTOMS   Redness and warmth.  Swelling.  Tenderness or pain.  Fever. DIAGNOSIS  Your caregiver can usually determine what is wrong based on a physical exam. Blood tests may also be done. TREATMENT  Treatment usually involves taking an antibiotic medicine. HOME CARE INSTRUCTIONS   Take your antibiotics as directed. Finish them even if you start to feel better.  Keep the infected arm or leg elevated to reduce swelling.  Apply a warm cloth to the affected area up to 4 times per day to relieve pain.  Only take over-the-counter or prescription medicines for pain, discomfort, or fever as directed by your caregiver.  Keep all follow-up appointments as directed by your caregiver. SEEK MEDICAL CARE IF:   You notice red streaks coming from the infected area.  Your red area gets larger or turns dark in color.  Your bone or joint underneath the infected area becomes painful after the skin has healed.  Your infection returns in the same area or another area.  You notice a swollen bump in the infected area.  You develop new symptoms. SEEK IMMEDIATE MEDICAL CARE IF:   You have a fever.  You feel very sleepy.  You develop vomiting or diarrhea.  You have a general ill feeling (malaise) with muscle aches and pains. MAKE SURE YOU:   Understand these instructions.  Will watch your condition.  Will get help right away if you are not doing well or get worse. Document Released: 04/11/2005 Document Revised: 01/01/2012 Document Reviewed: 09/17/2011 [facility] Patient Information 2014 [place].  Edema Edema is  an abnormal build-up of fluids in tissues. Because this is partly dependent on gravity (water flows to the lowest place), it is more common in the legs and thighs (lower extremities). It is also common in the looser tissues, like around the eyes. Painless swelling of the feet and ankles is common and increases as a person ages. It may affect both legs and may include the calves or even thighs. When squeezed, the fluid may move out of the affected area and may leave a dent for a few moments. CAUSES   Prolonged standing or sitting in one place for extended periods of time. Movement helps pump tissue fluid into the veins, and absence of movement prevents this, resulting in edema.  Varicose veins. The valves in the veins do not work as well as they should. This causes fluid to leak into the tissues.  Fluid and salt overload.  Injury, burn, or surgery to the leg, ankle, or foot, may damage veins and allow fluid to leak out.  Sunburn damages vessels. Leaky vessels allow fluid to go out into the sunburned tissues.  Allergies (from insect bites or stings, medications or chemicals) cause swelling by allowing vessels to become leaky.  Protein in the blood helps keep fluid in your vessels. Low protein, as in malnutrition, allows fluid to leak out.  Hormonal changes, including pregnancy and menstruation, cause fluid retention. This fluid may leak out of vessels and cause edema.  Medications that cause fluid retention. Examples are sex hormones, blood pressure medications, steroid treatment, or anti-depressants.  Some illnesses cause  edema, especially heart failure, kidney disease, or liver disease.  Surgery that cuts veins or lymph nodes, such as surgery done for the heart or for breast cancer, may result in edema. DIAGNOSIS  Your caregiver is usually easily able to determine what is causing your swelling (edema) by simply asking what is wrong (getting a history) and examining you (doing a physical).  Sometimes x-rays, EKG (electrocardiogram or heart tracing), and blood work may be done to evaluate for underlying medical illness. TREATMENT  General treatment includes:  Leg elevation (or elevation of the affected body part).  Restriction of fluid intake.  Prevention of fluid overload.  Compression of the affected body part. Compression with elastic bandages or support stockings squeezes the tissues, preventing fluid from entering and forcing it back into the blood vessels.  Diuretics (also called water pills or fluid pills) pull fluid out of your body in the form of increased urination. These are effective in reducing the swelling, but can have side effects and must be used only under your caregiver's supervision. Diuretics are appropriate only for some types of edema. The specific treatment can be directed at any underlying causes discovered. Heart, liver, or kidney disease should be treated appropriately. HOME CARE INSTRUCTIONS   Elevate the legs (or affected body part) above the level of the heart, while lying down.  Avoid sitting or standing still for prolonged periods of time.  Avoid putting anything directly under the knees when lying down, and do not wear constricting clothing or garters on the upper legs.  Exercising the legs causes the fluid to work back into the veins and lymphatic channels. This may help the swelling go down.  The pressure applied by elastic bandages or support stockings can help reduce ankle swelling.  A low-salt diet may help reduce fluid retention and decrease the ankle swelling.  Take any medications exactly as prescribed. SEEK MEDICAL CARE IF:  Your edema is not responding to recommended treatments. SEEK IMMEDIATE MEDICAL CARE IF:   You develop shortness of breath or chest pain.  You cannot breathe when you lay down; or if, while lying down, you have to get up and go to the window to get your breath.  You are having increasing swelling without  relief from treatment.  You develop a fever over 102 F (38.9 C).  You develop pain or redness in the areas that are swollen.  Tell your caregiver right away if you have gained 03 lb/1.4 kg in 1 day or 05 lb/2.3 kg in a week. MAKE SURE YOU:   Understand these instructions.  Will watch your condition.  Will get help right away if you are not doing well or get worse. Document Released: 07/02/2005 Document Revised: 01/01/2012 Document Reviewed: 02/18/2008 [facility] Patient Information 2014 [place].

## 2013-12-23 NOTE — Progress Notes (Signed)
Left lower extremity venous duplex completed.  Left:  No evidence of DVT, superficial thrombosis, or Baker's cyst.  Right:  Negative for DVT in the common femoral vein.  

## 2013-12-23 NOTE — ED Notes (Signed)
Spoke w/Sandra, Vascular Tech

## 2013-12-23 NOTE — ED Provider Notes (Signed)
CSN: [id number]     Arrival date & time 12/23/13  1819 History   First MD Initiated Contact with Patient 12/23/13 2050     Chief Complaint  Patient presents with  . Rash     (Consider location/radiation/quality/duration/timing/severity/associated sxs/prior Treatment) Patient is a 80 y.o. male presenting with general illness. The history is provided by the patient and the spouse.  Illness Severity:  Severe Onset quality:  Gradual Timing:  Constant Progression:  Worsening Chronicity:  New Associated symptoms: rash   Associated symptoms: no abdominal pain, no chest pain, no congestion, no cough, no fever, no headaches, no nausea, no rhinorrhea, no shortness of breath and no vomiting     80 yo male with LLE rash and swelling. Onset 2 weeks ago. Worsening. Weeping past ~4 days. Scaling. Minimal discomfort. No fevers. Redness. Feels warm to patient. Saw pcp. Referred to dermatology. Saw dermatology today. Prescribed ointment and recs for vaseline, elevation, compression. Directed to ED for evaluation for possible DVT No h/o DVT.  No trauma.  Occasionally itching.  Does have rash to trunk as well. Onset same time. Scaling.  Not had this previously.    Past Medical History  Diagnosis Date  . Microscopic hematuria   . Benign hypertensive heart disease without heart failure   . Dermatophytosis of nail   . Diverticulosis   . Other and unspecified hyperlipidemia   . Obstructive sleep apnea (adult) (pediatric)   . Obesity   . Hypothyroidism   . Seizure disorder   . Cardiovascular disease   . Hypertension   . Memory loss    Past Surgical History  Procedure Laterality Date  . Colonoscopy  2001  . Cardiac catheterization  2010  . Tumor excision  2010    Left Thigh   History reviewed. No pertinent family history. History  Substance Use Topics  . Smoking status: Former Smoker -- 0.50 packs/day for 15 years    Types: Cigarettes    Quit date: 07/16/1968  . Smokeless tobacco:  Never Used  . Alcohol Use: Yes     Comment: wine    Review of Systems  Constitutional: Negative for fever and chills.  HENT: Negative for congestion and rhinorrhea.   Respiratory: Negative for cough and shortness of breath.   Cardiovascular: Positive for leg swelling. Negative for chest pain.  Gastrointestinal: Negative for nausea, vomiting and abdominal pain.  Genitourinary: Negative for flank pain.  Musculoskeletal: Negative for back pain.  Skin: Positive for color change and rash. Negative for wound.  Neurological: Negative for weakness, numbness and headaches.  All other systems reviewed and are negative.     Allergies  Review of patient's allergies indicates no known allergies.  Home Medications   Prior to Admission medications   Medication Sig Start Date End Date Taking? Authorizing Provider  acetaminophen (TYLENOL) 500 MG tablet Take 500 mg by mouth every 6 (six) hours as needed for mild pain. PER BOTTLE INSTRUCTIONS   Yes Historical Provider, MD  amLODipine (NORVASC) 10 MG tablet 1/2 tablet daily 02/15/13  Yes Tanda Rockers, MD  aspirin 81 MG tablet Take 81 mg by mouth daily.     Yes Historical Provider, MD  atenolol (TENORMIN) 25 MG tablet Take 25 mg by mouth daily.   Yes Historical Provider, MD  cephALEXin (KEFLEX) 500 MG capsule Take 1 capsule (500 mg total) by mouth 4 (four) times daily. 12/22/13  Yes Tammy S Parrett, NP  hydrOXYzine (ATARAX/VISTARIL) 25 MG tablet 1-2 every 4 hours as needed for  itching 12/10/13  Yes Tanda Rockers, MD  ibuprofen (ADVIL,MOTRIN) 200 MG tablet Take 200 mg by mouth every 6 (six) hours as needed for moderate pain. UP TO 12 DAILY    Yes Historical Provider, MD  levothyroxine (SYNTHROID) 100 MCG tablet Take 1 tablet (100 mcg total) by mouth daily before breakfast. 07/22/13  Yes Tanda Rockers, MD  losartan (COZAAR) 50 MG tablet Take 50 mg by mouth daily.   Yes Historical Provider, MD  Multiple Vitamins-Minerals (MULTIVITAMIN & MINERAL PO) Take 1  tablet by mouth daily.     Yes Historical Provider, MD  cephALEXin (KEFLEX) 500 MG capsule Take 1 capsule (500 mg total) by mouth every 6 (six) hours. 12/23/13   Bonnita Hollow, MD  simvastatin (ZOCOR) 40 MG tablet 1/2 tablet at bedtime 06/16/13   Tanda Rockers, MD   BP 144/72  Pulse 70  Temp(Src) 97.8 F (36.6 C) (Oral)  Resp 18  Wt 204 lb (92.534 kg)  SpO2 100% Physical Exam  Nursing note and vitals reviewed. Constitutional: He is oriented to person, place, and time. He appears well-developed and well-nourished. No distress.  HENT:  Head: Normocephalic and atraumatic.  Eyes: Conjunctivae are normal. Right eye exhibits no discharge. Left eye exhibits no discharge.  Neck: No tracheal deviation present.  Cardiovascular: Normal rate, regular rhythm, normal heart sounds and intact distal pulses.   Pulmonary/Chest: Effort normal. No stridor. No respiratory distress.  Abdominal: Soft. There is no tenderness.  Musculoskeletal: He exhibits edema (moderate LLE. weeping). He exhibits no tenderness.  2+ DP pulse LLE  Neurological: He is alert and oriented to person, place, and time.  Skin: Skin is warm and dry. Rash noted. There is pallor (scaling erythematous rash to LLE. also with some erythematous scales to chest wall. no excoriations. no drainage from chest. ).  Psychiatric: He has a normal mood and affect. His behavior is normal.    ED Course  Procedures (including critical care time) Labs Review Labs Reviewed - No data to display  Imaging Review No results found.   EKG Interpretation None      MDM   Final diagnoses:  Leg edema  Cellulitis    Leg swelling and rash. Concern for cellulitis. Treat with keflex. 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  Good pulse and without evidence of ischemia Duplex negative for DVT Without crepitus or other findings to suggest neg fasc.  Patient discharged home. Return  precautions given. To follow up with pcp and vascular. Patient and wife in agreement with plan.  Labs and imaging reviewed by myself and considered in medical decision making if ordered. Imaging interpreted by radiology.   Discussed case with Dr. Vanita Panda who is in agreement with assessment and plan.    Bonnita Hollow, MD 12/24/13 [phone]

## 2013-12-23 NOTE — ED Notes (Signed)
Vascular at bedside to perform study.

## 2013-12-24 LAB — WOUND CULTURE: GRAM STAIN: NONE SEEN

## 2013-12-25 NOTE — ED Provider Notes (Signed)
This patient was seen in conjunction with Dr. Hyman Hopes (the resident physician). The documentation accurately reflects the patients ED evaluation and management with the following clarifications.  On my exam the patient was AOx3 w good distal cap refill and no e/o systemic infection.  I had a lengthy conversation with the patient and his wife about the negative [place], and the presumed cause of his rash.  I encouraged them to continued frequent re-evals with derm, and to return for changes.   Carmin Muskrat, MD 12/26/13 0000

## 2013-12-30 NOTE — Progress Notes (Signed)
Quick Note:  Pt scheduled to see MW 6.30.15 ______

## 2014-01-12 ENCOUNTER — Ambulatory Visit (INDEPENDENT_AMBULATORY_CARE_PROVIDER_SITE_OTHER): Payer: Medicare Other | Admitting: Internal Medicine

## 2014-01-12 ENCOUNTER — Encounter: Payer: Self-pay | Admitting: Internal Medicine

## 2014-01-12 VITALS — BP 120/80 | HR 69 | Temp 97.7°F | Ht 71.0 in | Wt 200.6 lb

## 2014-01-12 DIAGNOSIS — L259 Unspecified contact dermatitis, unspecified cause: Secondary | ICD-10-CM

## 2014-01-12 DIAGNOSIS — L309 Dermatitis, unspecified: Secondary | ICD-10-CM

## 2014-01-12 MED ORDER — ATENOLOL 25 MG PO TABS: 25.0000 mg | ORAL_TABLET | Freq: Every day | ORAL | Status: AC

## 2014-01-12 MED ORDER — LOSARTAN POTASSIUM 50 MG PO TABS: 50.0000 mg | ORAL_TABLET | Freq: Every day | ORAL | Status: AC

## 2014-01-12 MED ORDER — SIMVASTATIN 40 MG PO TABS: ORAL_TABLET | ORAL | Status: AC

## 2014-01-12 MED ORDER — LEVOTHYROXINE SODIUM 100 MCG PO TABS: 100.0000 ug | ORAL_TABLET | Freq: Every day | ORAL | Status: AC

## 2014-01-12 MED ORDER — HYDROXYZINE HCL 25 MG PO TABS: ORAL_TABLET | ORAL | Status: AC

## 2014-01-12 NOTE — Progress Notes (Signed)
Subjective:    Patient ID: [name], male    DOB: 1929/10/03    MRN: [id number]    Brief patient profile:  28 yowm with history of hypertenision, hyperlipidemia, and hypothyroidism.    History of Present Illness  July 19, 2008 cpx recovering from sarcoma surgery at [facility] and has been referred to Oncology and planning to start chemo with extensive labs and xrays there in 12/09.   December 30, 2008 ov for eval of orthostatic lightheadedness resolved off amlodipine for recheck bp doing well now. rec stop amlodipine and no more spells ok self monitoring   Admit [facility] 11/11-14/2011 syncope, hyponatremia, nl echo and MRA of brain > d/c'd hctz      04/25/2011 f/u ov/[name] CPX some urinary freq no hesitation or urgency or nocturia. rec Add amlodipine 5mg  one each am - hold this if light headed standing I would defer to Medical Oncologist re what studies for surveillance for recurrence of cancer   06/26/2011 f/u ov/[name] for f/u hbp/mild cri cc L knee pain x one month attributed to more climbing steps, no swelling or stiffness.  Has not tried advil yet. Also c/o mild chronic hearing loss s tinnitis or ear pain. rec Take advil up to 4 with meals as needed and if not better within a week or two you to be seen by [name] or [name].  Dr Constance Holster and Wilburn Cornelia are ENT doctors we can refer you to if desired for hearing loss      07/21/2013 f/u ov/[name] re: hbp, chol, low thyroid Chief Complaint  Patient presents with  . Follow-up    Pt states doing well and denies any co's today.  very inactive/ no hobbies, denies feeling blue although wife concerned he's listless, depressed  Also c/o hearing loss bilaterally. rec Please see patient coordinator before you leave today  to schedule audiology evaluation > hearing aids Synthroid up from 75 to 100 LDL only 65 ? If Zocor contributing to some of his symptoms > try 20 mg per day    09/07/2013 f/u ov/[name] re: hbp/hyperlipdemia/hypothyroid Chief  Complaint  Patient presents with  . Follow-up    Pt states that he is doing well and denies any new co's today.   very stressed with son's divorce, no hobbies.  Not limited by breathing from desired activities / no ex cp/ sob/tia/claudication Sleeping well  rec psychology eval > completed   12/10/2013 f/u ov/[name] re: hbp/ hyperlipidemia/hypothyroid / new rash  Chief Complaint  Patient presents with  . Follow-up    Pt c/o itchy rash "all over" x 1 wk. He states this is not painful.    bad abute onset x 2 week pts lower central back pain different from anything before, very positional, acute onset s provocation, no radiation to legs, 100% resolved with aleve then started with rash legs > arms and trunk, very pruritic, has no exp exp mwoing grass, has not tried benadryl yet.  >pred pack and hydroxine   12/22/2013 Acute OV  Returns for persistent rash.  Seen 2 weeks ago for pruritic rash unknown etiology .  Says noted a rash: red, raised, itching, that developed 2 weeks ago. Was seen in office , tx w/ prednisone  And hydroxine. No better and now left leg is worse with sigificant scaling, weeping and redness. No fever, n/v/d or dyspnea.  No recent travel, known insect bite, new meds. Was taken off NSAIDS last ov in case there was an interaction.  Labs showed bmet and  lft unrevealing.  Says left leg is more red, and swollen. No leg pain or weakness. No dyspnea or difficulty swallowing.  Bumps are very itchy rec derm eval > cellulitis with id/ r/o venous dz with dopplers > neg   01/12/2014 f/u ov/[name] re: cellulitis LLE with ID rxn  Chief Complaint  Patient presents with  . Follow-up    Pt states rash is improved on his leg. Pt denies any other complaints at this time.      No further rash/ itching over torso  No obvious day to day or daytime variabilty or assoc chronic cough or cp or chest tightness, subjective wheeze overt sinus or hb symptoms. No unusual exp hx or h/o childhood pna/  asthma or knowledge of premature birth.  Sleeping ok without nocturnal  or early am exacerbation  of respiratory  c/o's or need for noct saba. Also denies any obvious fluctuation of symptoms with weather or environmental changes or other aggravating or alleviating factors except as outlined above   Current Medications, Allergies, Complete Past Medical History, Past Surgical History, Family History, and Social History were reviewed in [employer] record.  ROS  The following are not active complaints unless bolded sore throat, dysphagia, dental problems, itching, sneezing,  nasal congestion or excess/ purulent secretions, ear ache,   fever, chills, sweats, unintended wt loss, pleuritic or exertional cp, hemoptysis,  orthopnea pnd or leg swelling, presyncope, palpitations, heartburn, abdominal pain, anorexia, nausea, vomiting, diarrhea  or change in bowel or urinary habits, change in stools or urine, dysuria,hematuria,  rash, arthralgias, visual complaints, headache, numbness weakness or ataxia or problems with walking or coordination,  change in mood/affect or memory.                         Past Medical History:  MICROSCOPIC HEMATURIA (ICD-599.72)...................................................Marland KitchenWrenn  HYPERTENSIVE CARDIOVASCULAR DISEASE, BENIGN (ICD-402.10)  ONYCHOMYCOSIS (ICD-110.1)  DIVERTICULOSIS.........................................................................................Marland KitchenFuller Plan  - See colonscopy 03/2000  HYPERLIPIDEMIA (ICD-272.4)  - Target LDL < 70 (hbp, male, pos fm hx/ pos mrangiogram 08/2003 ascvd  SLEEP APNEA, OBSTRUCTIVE (ICD-327.23)  OBESITY (ICD-278.00)  - Target wt = 208 for BMI < 30  HYPOTHYROIDISM (ICD-244.9)  SEIZURE DISORDER (ICD-780.39)  CARDIOVASCULAR DISEASE (ICD-429.2)  HYPERTENSION (ICD-401.9)  Sarcoma left leg........................................................................................................Marland KitchenWard, W  [facility])  - 07/14/08 sarcoma removal left femur > last rx July 2010 > CR by scans 07/2009 due for repeat 05/2011 - Chemo complete 12/2008  Memory Loss indolent onset 2011 - CT Head February 20, 2010 >>> Atrophy and chronic microvascular ischemia. No acute abnormality. - MMSE 03/02/10 30/30 HEALTH MAINTENANCE.............................................................................................Marland KitchenWert  - Pneumovax 04/1999  - DT 04/2006  - CPX  06/11/2012          Objective:   Physical Exam   wt 214 July 19, 2008 >  06/26/2011 209>  01/03/2012  206>06/11/2012  203> 207 07/30/2012 > 07/21/2013 206 > 09/07/2013  204 > 12/10/2013   205 >205 12/22/2013 > 01/12/14 200  Ambulatory healthy appearing in no acute distress.  Does not appear depressed  HEENT: nl dentition, turbinates, and orophanx. Nl external ear canals with mod wax on R none on L  without cough reflex/ grossly nl hearing  Neck without JVD/Nodes/TM  Lungs clear to A and P bilaterally without cough on insp or exp maneuvers  RRR no s3 or murmur or increase in P2  Abd soft and benign with nl excursion in the supine position. No bruits or organomegaly  Ext warm without calf tenderness, cyanosis clubbing.  Skin still erythematous LLE but not tender, no obvious skin cracking including toe webs, min swelling vs R           Assessment & Plan:

## 2014-01-12 NOTE — Patient Instructions (Addendum)
For itching take vistaril as needed and elevate your Left  leg as high as possible while sleeping.  For any worsening  itching burning redness > Dr Tonia Brooms needs to re-evaluate you    For primary care in [place] I recommend  Nathaneil Canary or anyone in his group taking new patients  Address: [address], [place], [place] [address]  [phone]

## 2014-01-14 ENCOUNTER — Encounter: Payer: Self-pay | Admitting: Internal Medicine

## 2014-01-14 NOTE — Assessment & Plan Note (Addendum)
Onset 11/2013  - rx pred/vistaril 12/10/2013 >>> -worse >refer to derm 12/22/2013  > cellulitis with ID RxN, improved 01/12/14   No obvious source for cellulitis but he does likely have poor lymph drainage LLE p sarcoma surgery with neg ven dopplers 610/15   No further abx needed, just elevation and f/u derm prn  See instructions for specific recommendations which were reviewed directly with the patient who was given a copy with highlighter outlining the key components.

## 2014-03-16 ENCOUNTER — Other Ambulatory Visit: Payer: Self-pay | Admitting: Internal Medicine

## 2015-01-05 ENCOUNTER — Other Ambulatory Visit: Payer: Self-pay | Admitting: Internal Medicine

## 2015-02-11 ENCOUNTER — Other Ambulatory Visit: Payer: Self-pay | Admitting: Internal Medicine
